# Patient Record
Sex: Female | Born: 1990
Health system: Southern US, Community
[De-identification: ages and names within clinical notes are randomized; demographics above are authoritative.]

## PROBLEM LIST (undated history)

## (undated) DIAGNOSIS — D649 Anemia, unspecified: Secondary | ICD-10-CM

## (undated) HISTORY — PX: WISDOM TOOTH EXTRACTION: SHX21

## (undated) HISTORY — PX: TONSILLECTOMY: SUR1361

## (undated) HISTORY — PX: CHOLECYSTECTOMY: SHX55

---

## 2019-09-10 DIAGNOSIS — Z131 Encounter for screening for diabetes mellitus: Secondary | ICD-10-CM | POA: Diagnosis not present

## 2019-09-10 DIAGNOSIS — Z13220 Encounter for screening for lipoid disorders: Secondary | ICD-10-CM | POA: Diagnosis not present

## 2019-09-10 DIAGNOSIS — Z0001 Encounter for general adult medical examination with abnormal findings: Secondary | ICD-10-CM | POA: Diagnosis not present

## 2019-09-10 DIAGNOSIS — Z23 Encounter for immunization: Secondary | ICD-10-CM | POA: Diagnosis not present

## 2019-09-18 DIAGNOSIS — Z0184 Encounter for antibody response examination: Secondary | ICD-10-CM | POA: Diagnosis not present

## 2019-09-18 DIAGNOSIS — M9902 Segmental and somatic dysfunction of thoracic region: Secondary | ICD-10-CM | POA: Diagnosis not present

## 2019-09-18 DIAGNOSIS — M542 Cervicalgia: Secondary | ICD-10-CM | POA: Diagnosis not present

## 2019-09-18 DIAGNOSIS — G44219 Episodic tension-type headache, not intractable: Secondary | ICD-10-CM | POA: Diagnosis not present

## 2019-09-18 DIAGNOSIS — M9901 Segmental and somatic dysfunction of cervical region: Secondary | ICD-10-CM | POA: Diagnosis not present

## 2019-09-25 DIAGNOSIS — M542 Cervicalgia: Secondary | ICD-10-CM | POA: Diagnosis not present

## 2019-09-25 DIAGNOSIS — G44219 Episodic tension-type headache, not intractable: Secondary | ICD-10-CM | POA: Diagnosis not present

## 2019-09-25 DIAGNOSIS — M9901 Segmental and somatic dysfunction of cervical region: Secondary | ICD-10-CM | POA: Diagnosis not present

## 2019-09-25 DIAGNOSIS — M9902 Segmental and somatic dysfunction of thoracic region: Secondary | ICD-10-CM | POA: Diagnosis not present

## 2019-09-26 DIAGNOSIS — G44219 Episodic tension-type headache, not intractable: Secondary | ICD-10-CM | POA: Diagnosis not present

## 2019-09-26 DIAGNOSIS — M9902 Segmental and somatic dysfunction of thoracic region: Secondary | ICD-10-CM | POA: Diagnosis not present

## 2019-09-26 DIAGNOSIS — M9901 Segmental and somatic dysfunction of cervical region: Secondary | ICD-10-CM | POA: Diagnosis not present

## 2019-09-26 DIAGNOSIS — M542 Cervicalgia: Secondary | ICD-10-CM | POA: Diagnosis not present

## 2019-09-30 DIAGNOSIS — M542 Cervicalgia: Secondary | ICD-10-CM | POA: Diagnosis not present

## 2019-09-30 DIAGNOSIS — M9902 Segmental and somatic dysfunction of thoracic region: Secondary | ICD-10-CM | POA: Diagnosis not present

## 2019-09-30 DIAGNOSIS — G44219 Episodic tension-type headache, not intractable: Secondary | ICD-10-CM | POA: Diagnosis not present

## 2019-09-30 DIAGNOSIS — M9901 Segmental and somatic dysfunction of cervical region: Secondary | ICD-10-CM | POA: Diagnosis not present

## 2019-10-01 DIAGNOSIS — M542 Cervicalgia: Secondary | ICD-10-CM | POA: Diagnosis not present

## 2019-10-01 DIAGNOSIS — M9902 Segmental and somatic dysfunction of thoracic region: Secondary | ICD-10-CM | POA: Diagnosis not present

## 2019-10-01 DIAGNOSIS — Z6820 Body mass index (BMI) 20.0-20.9, adult: Secondary | ICD-10-CM | POA: Diagnosis not present

## 2019-10-01 DIAGNOSIS — M9901 Segmental and somatic dysfunction of cervical region: Secondary | ICD-10-CM | POA: Diagnosis not present

## 2019-10-01 DIAGNOSIS — Z124 Encounter for screening for malignant neoplasm of cervix: Secondary | ICD-10-CM | POA: Diagnosis not present

## 2019-10-01 DIAGNOSIS — Z01419 Encounter for gynecological examination (general) (routine) without abnormal findings: Secondary | ICD-10-CM | POA: Diagnosis not present

## 2019-10-01 DIAGNOSIS — G44219 Episodic tension-type headache, not intractable: Secondary | ICD-10-CM | POA: Diagnosis not present

## 2019-10-02 DIAGNOSIS — M542 Cervicalgia: Secondary | ICD-10-CM | POA: Diagnosis not present

## 2019-10-02 DIAGNOSIS — G44219 Episodic tension-type headache, not intractable: Secondary | ICD-10-CM | POA: Diagnosis not present

## 2019-10-02 DIAGNOSIS — M9901 Segmental and somatic dysfunction of cervical region: Secondary | ICD-10-CM | POA: Diagnosis not present

## 2019-10-02 DIAGNOSIS — M9902 Segmental and somatic dysfunction of thoracic region: Secondary | ICD-10-CM | POA: Diagnosis not present

## 2019-10-07 DIAGNOSIS — G44219 Episodic tension-type headache, not intractable: Secondary | ICD-10-CM | POA: Diagnosis not present

## 2019-10-07 DIAGNOSIS — M542 Cervicalgia: Secondary | ICD-10-CM | POA: Diagnosis not present

## 2019-10-07 DIAGNOSIS — M9902 Segmental and somatic dysfunction of thoracic region: Secondary | ICD-10-CM | POA: Diagnosis not present

## 2019-10-07 DIAGNOSIS — M9901 Segmental and somatic dysfunction of cervical region: Secondary | ICD-10-CM | POA: Diagnosis not present

## 2019-10-09 DIAGNOSIS — M542 Cervicalgia: Secondary | ICD-10-CM | POA: Diagnosis not present

## 2019-10-09 DIAGNOSIS — M9901 Segmental and somatic dysfunction of cervical region: Secondary | ICD-10-CM | POA: Diagnosis not present

## 2019-10-09 DIAGNOSIS — G44219 Episodic tension-type headache, not intractable: Secondary | ICD-10-CM | POA: Diagnosis not present

## 2019-10-09 DIAGNOSIS — M9902 Segmental and somatic dysfunction of thoracic region: Secondary | ICD-10-CM | POA: Diagnosis not present

## 2019-10-10 DIAGNOSIS — M9901 Segmental and somatic dysfunction of cervical region: Secondary | ICD-10-CM | POA: Diagnosis not present

## 2019-10-10 DIAGNOSIS — G44219 Episodic tension-type headache, not intractable: Secondary | ICD-10-CM | POA: Diagnosis not present

## 2019-10-10 DIAGNOSIS — M542 Cervicalgia: Secondary | ICD-10-CM | POA: Diagnosis not present

## 2019-10-10 DIAGNOSIS — M9902 Segmental and somatic dysfunction of thoracic region: Secondary | ICD-10-CM | POA: Diagnosis not present

## 2019-10-14 DIAGNOSIS — M9901 Segmental and somatic dysfunction of cervical region: Secondary | ICD-10-CM | POA: Diagnosis not present

## 2019-10-14 DIAGNOSIS — G44219 Episodic tension-type headache, not intractable: Secondary | ICD-10-CM | POA: Diagnosis not present

## 2019-10-14 DIAGNOSIS — M542 Cervicalgia: Secondary | ICD-10-CM | POA: Diagnosis not present

## 2019-10-14 DIAGNOSIS — M9902 Segmental and somatic dysfunction of thoracic region: Secondary | ICD-10-CM | POA: Diagnosis not present

## 2019-10-16 DIAGNOSIS — M9902 Segmental and somatic dysfunction of thoracic region: Secondary | ICD-10-CM | POA: Diagnosis not present

## 2019-10-16 DIAGNOSIS — G44219 Episodic tension-type headache, not intractable: Secondary | ICD-10-CM | POA: Diagnosis not present

## 2019-10-16 DIAGNOSIS — M542 Cervicalgia: Secondary | ICD-10-CM | POA: Diagnosis not present

## 2019-10-16 DIAGNOSIS — M9901 Segmental and somatic dysfunction of cervical region: Secondary | ICD-10-CM | POA: Diagnosis not present

## 2019-10-17 DIAGNOSIS — M542 Cervicalgia: Secondary | ICD-10-CM | POA: Diagnosis not present

## 2019-10-17 DIAGNOSIS — M9902 Segmental and somatic dysfunction of thoracic region: Secondary | ICD-10-CM | POA: Diagnosis not present

## 2019-10-17 DIAGNOSIS — M9901 Segmental and somatic dysfunction of cervical region: Secondary | ICD-10-CM | POA: Diagnosis not present

## 2019-10-17 DIAGNOSIS — G44219 Episodic tension-type headache, not intractable: Secondary | ICD-10-CM | POA: Diagnosis not present

## 2019-10-21 DIAGNOSIS — M9901 Segmental and somatic dysfunction of cervical region: Secondary | ICD-10-CM | POA: Diagnosis not present

## 2019-10-21 DIAGNOSIS — M9902 Segmental and somatic dysfunction of thoracic region: Secondary | ICD-10-CM | POA: Diagnosis not present

## 2019-10-21 DIAGNOSIS — M542 Cervicalgia: Secondary | ICD-10-CM | POA: Diagnosis not present

## 2019-10-21 DIAGNOSIS — G44219 Episodic tension-type headache, not intractable: Secondary | ICD-10-CM | POA: Diagnosis not present

## 2019-10-23 DIAGNOSIS — M9902 Segmental and somatic dysfunction of thoracic region: Secondary | ICD-10-CM | POA: Diagnosis not present

## 2019-10-23 DIAGNOSIS — M9901 Segmental and somatic dysfunction of cervical region: Secondary | ICD-10-CM | POA: Diagnosis not present

## 2019-10-23 DIAGNOSIS — G44219 Episodic tension-type headache, not intractable: Secondary | ICD-10-CM | POA: Diagnosis not present

## 2019-10-23 DIAGNOSIS — M542 Cervicalgia: Secondary | ICD-10-CM | POA: Diagnosis not present

## 2019-10-24 DIAGNOSIS — Z113 Encounter for screening for infections with a predominantly sexual mode of transmission: Secondary | ICD-10-CM | POA: Diagnosis not present

## 2019-10-24 DIAGNOSIS — M542 Cervicalgia: Secondary | ICD-10-CM | POA: Diagnosis not present

## 2019-10-24 DIAGNOSIS — Z3490 Encounter for supervision of normal pregnancy, unspecified, unspecified trimester: Secondary | ICD-10-CM | POA: Diagnosis not present

## 2019-10-24 DIAGNOSIS — Z3201 Encounter for pregnancy test, result positive: Secondary | ICD-10-CM | POA: Diagnosis not present

## 2019-10-24 DIAGNOSIS — G44219 Episodic tension-type headache, not intractable: Secondary | ICD-10-CM | POA: Diagnosis not present

## 2019-10-24 DIAGNOSIS — Z6821 Body mass index (BMI) 21.0-21.9, adult: Secondary | ICD-10-CM | POA: Diagnosis not present

## 2019-10-24 DIAGNOSIS — N925 Other specified irregular menstruation: Secondary | ICD-10-CM | POA: Diagnosis not present

## 2019-10-24 DIAGNOSIS — M9901 Segmental and somatic dysfunction of cervical region: Secondary | ICD-10-CM | POA: Diagnosis not present

## 2019-10-24 DIAGNOSIS — M9902 Segmental and somatic dysfunction of thoracic region: Secondary | ICD-10-CM | POA: Diagnosis not present

## 2019-10-28 DIAGNOSIS — M9902 Segmental and somatic dysfunction of thoracic region: Secondary | ICD-10-CM | POA: Diagnosis not present

## 2019-10-28 DIAGNOSIS — M9901 Segmental and somatic dysfunction of cervical region: Secondary | ICD-10-CM | POA: Diagnosis not present

## 2019-10-28 DIAGNOSIS — G44219 Episodic tension-type headache, not intractable: Secondary | ICD-10-CM | POA: Diagnosis not present

## 2019-10-28 DIAGNOSIS — M542 Cervicalgia: Secondary | ICD-10-CM | POA: Diagnosis not present

## 2019-10-29 DIAGNOSIS — M542 Cervicalgia: Secondary | ICD-10-CM | POA: Diagnosis not present

## 2019-10-29 DIAGNOSIS — G44219 Episodic tension-type headache, not intractable: Secondary | ICD-10-CM | POA: Diagnosis not present

## 2019-10-29 DIAGNOSIS — M9901 Segmental and somatic dysfunction of cervical region: Secondary | ICD-10-CM | POA: Diagnosis not present

## 2019-10-29 DIAGNOSIS — M9902 Segmental and somatic dysfunction of thoracic region: Secondary | ICD-10-CM | POA: Diagnosis not present

## 2019-10-30 DIAGNOSIS — M9901 Segmental and somatic dysfunction of cervical region: Secondary | ICD-10-CM | POA: Diagnosis not present

## 2019-10-30 DIAGNOSIS — M542 Cervicalgia: Secondary | ICD-10-CM | POA: Diagnosis not present

## 2019-10-30 DIAGNOSIS — G44219 Episodic tension-type headache, not intractable: Secondary | ICD-10-CM | POA: Diagnosis not present

## 2019-10-30 DIAGNOSIS — M9902 Segmental and somatic dysfunction of thoracic region: Secondary | ICD-10-CM | POA: Diagnosis not present

## 2019-11-04 DIAGNOSIS — M9902 Segmental and somatic dysfunction of thoracic region: Secondary | ICD-10-CM | POA: Diagnosis not present

## 2019-11-04 DIAGNOSIS — M542 Cervicalgia: Secondary | ICD-10-CM | POA: Diagnosis not present

## 2019-11-04 DIAGNOSIS — M9901 Segmental and somatic dysfunction of cervical region: Secondary | ICD-10-CM | POA: Diagnosis not present

## 2019-11-04 DIAGNOSIS — G44219 Episodic tension-type headache, not intractable: Secondary | ICD-10-CM | POA: Diagnosis not present

## 2019-11-06 DIAGNOSIS — M9901 Segmental and somatic dysfunction of cervical region: Secondary | ICD-10-CM | POA: Diagnosis not present

## 2019-11-06 DIAGNOSIS — M542 Cervicalgia: Secondary | ICD-10-CM | POA: Diagnosis not present

## 2019-11-06 DIAGNOSIS — M9902 Segmental and somatic dysfunction of thoracic region: Secondary | ICD-10-CM | POA: Diagnosis not present

## 2019-11-06 DIAGNOSIS — G44219 Episodic tension-type headache, not intractable: Secondary | ICD-10-CM | POA: Diagnosis not present

## 2019-11-08 NOTE — L&D Delivery Note (Signed)
Delivery Note At 10:30 PM a viable and healthy female was delivered via  (Presentation: Left Occiput Anterior).  APGAR: 9, 9; weight pending .   Placenta status: Spontaneous, Intact.  Cord: 3 vessels   The patient pushed for an hour and twelve minutes without an epidural . She delivered a vigorous female infant in the vertex LOA presentation with Apgars of 9 at 1 and 9 at 5 minutes. After a 1 minute delay in cord clamping the cord was clamped and cut. The placenta delivered spontaneously, intact, with a 3V cord. A 2nd degree perineal laceration was repaired with 3-0 vicryl rapide after the perineum was infiltrated with 1% lidocaine. Mom and baby are doing well after delivery  Anesthesia: Local Episiotomy: None Lacerations: 2nd degree Suture Repair: 3.0 vicryl rapide Est. Blood Loss (mL):  317 cc  Mom to postpartum.  Baby to Couplet care / Skin to Skin.  Waynard Reeds 06/07/2020, 10:57 PM

## 2019-11-11 DIAGNOSIS — M9902 Segmental and somatic dysfunction of thoracic region: Secondary | ICD-10-CM | POA: Diagnosis not present

## 2019-11-11 DIAGNOSIS — M9901 Segmental and somatic dysfunction of cervical region: Secondary | ICD-10-CM | POA: Diagnosis not present

## 2019-11-11 DIAGNOSIS — M542 Cervicalgia: Secondary | ICD-10-CM | POA: Diagnosis not present

## 2019-11-11 DIAGNOSIS — G44219 Episodic tension-type headache, not intractable: Secondary | ICD-10-CM | POA: Diagnosis not present

## 2019-11-14 DIAGNOSIS — Z1389 Encounter for screening for other disorder: Secondary | ICD-10-CM | POA: Diagnosis not present

## 2019-11-14 DIAGNOSIS — Z3481 Encounter for supervision of other normal pregnancy, first trimester: Secondary | ICD-10-CM | POA: Diagnosis not present

## 2019-11-14 DIAGNOSIS — Z369 Encounter for antenatal screening, unspecified: Secondary | ICD-10-CM | POA: Diagnosis not present

## 2019-11-18 DIAGNOSIS — M9901 Segmental and somatic dysfunction of cervical region: Secondary | ICD-10-CM | POA: Diagnosis not present

## 2019-11-18 DIAGNOSIS — G44219 Episodic tension-type headache, not intractable: Secondary | ICD-10-CM | POA: Diagnosis not present

## 2019-11-18 DIAGNOSIS — M9902 Segmental and somatic dysfunction of thoracic region: Secondary | ICD-10-CM | POA: Diagnosis not present

## 2019-11-18 DIAGNOSIS — M542 Cervicalgia: Secondary | ICD-10-CM | POA: Diagnosis not present

## 2019-11-27 DIAGNOSIS — Z3480 Encounter for supervision of other normal pregnancy, unspecified trimester: Secondary | ICD-10-CM | POA: Diagnosis not present

## 2019-11-27 DIAGNOSIS — Z369 Encounter for antenatal screening, unspecified: Secondary | ICD-10-CM | POA: Diagnosis not present

## 2019-12-25 DIAGNOSIS — Z369 Encounter for antenatal screening, unspecified: Secondary | ICD-10-CM | POA: Diagnosis not present

## 2019-12-25 DIAGNOSIS — Z3480 Encounter for supervision of other normal pregnancy, unspecified trimester: Secondary | ICD-10-CM | POA: Diagnosis not present

## 2020-01-22 DIAGNOSIS — Z363 Encounter for antenatal screening for malformations: Secondary | ICD-10-CM | POA: Diagnosis not present

## 2020-01-23 ENCOUNTER — Ambulatory Visit: Payer: Self-pay | Attending: Internal Medicine

## 2020-01-23 DIAGNOSIS — Z23 Encounter for immunization: Secondary | ICD-10-CM

## 2020-01-23 NOTE — Progress Notes (Signed)
   Covid-19 Vaccination Clinic  Name:  Evelyn Lara    MRN: [id number] DOB: 01-09-1991  01/23/2020  Evelyn Lara was observed post Covid-19 immunization for 15 minutes without incident. She was provided with Vaccine Information Sheet and instruction to access the V-Safe system.   Evelyn Lara was instructed to call 911 with any severe reactions post vaccine: Marland Kitchen Difficulty breathing  . Swelling of face and throat  . A fast heartbeat  . A bad rash all over body  . Dizziness and weakness   Immunizations Administered    Name Date Dose VIS Date Route   Pfizer COVID-19 Vaccine 01/23/2020  1:24 PM 0.3 mL 10/18/2019 Intramuscular   Manufacturer: [employer], [employer]   Lot: [id number]   NDC: [address]-5

## 2020-02-17 ENCOUNTER — Ambulatory Visit: Payer: Self-pay | Attending: Internal Medicine

## 2020-02-17 DIAGNOSIS — Z23 Encounter for immunization: Secondary | ICD-10-CM

## 2020-02-17 NOTE — Progress Notes (Signed)
   Covid-19 Vaccination Clinic  Name:  Harjit Douds    MRN: [id number] DOB: January 23, 1991  02/17/2020  Ms. Catano was observed post Covid-19 immunization for 15 minutes without incident. She was provided with Vaccine Information Sheet and instruction to access the V-Safe system.   Ms. Fichter was instructed to call 911 with any severe reactions post vaccine: Marland Kitchen Difficulty breathing  . Swelling of face and throat  . A fast heartbeat  . A bad rash all over body  . Dizziness and weakness   Immunizations Administered    Name Date Dose VIS Date Route   Pfizer COVID-19 Vaccine 02/17/2020  4:41 PM 0.3 mL 10/18/2019 Intramuscular   Manufacturer: [employer], [employer]   Lot: [id number]   NDC: [address]-8

## 2020-02-20 DIAGNOSIS — Z369 Encounter for antenatal screening, unspecified: Secondary | ICD-10-CM | POA: Diagnosis not present

## 2020-03-09 DIAGNOSIS — Z3483 Encounter for supervision of other normal pregnancy, third trimester: Secondary | ICD-10-CM | POA: Diagnosis not present

## 2020-03-09 DIAGNOSIS — Z3482 Encounter for supervision of other normal pregnancy, second trimester: Secondary | ICD-10-CM | POA: Diagnosis not present

## 2020-03-12 ENCOUNTER — Other Ambulatory Visit: Payer: Self-pay

## 2020-03-12 ENCOUNTER — Encounter (HOSPITAL_COMMUNITY): Payer: Self-pay

## 2020-03-12 ENCOUNTER — Inpatient Hospital Stay (HOSPITAL_COMMUNITY)
Admission: AD | Admit: 2020-03-12 | Discharge: 2020-03-12 | Disposition: A | Discharge status: Home / Self Care | Payer: BC Managed Care – PPO | Attending: Obstetrics | Admitting: Obstetrics

## 2020-03-12 DIAGNOSIS — Z3A27 27 weeks gestation of pregnancy: Secondary | ICD-10-CM | POA: Insufficient documentation

## 2020-03-12 DIAGNOSIS — O368120 Decreased fetal movements, second trimester, not applicable or unspecified: Secondary | ICD-10-CM | POA: Insufficient documentation

## 2020-03-12 NOTE — MAU Note (Signed)
.   Evelyn Lara is a 29 y.o. at [redacted]w[redacted]d here in MAU reporting: Patient reports decreased fetal movement all day but began to worry around 1620 this afternoon. She tried to do kick counts with food and cold water but she states that he still only moved slightly. No VB or LOF.   Pain score: 0 Vitals:   03/12/20 1851  BP: 126/67  Pulse: 65  Resp: 16  Temp: 97.9 F (36.6 C)  SpO2: 100%     FHT:140 Lab orders placed from triage:

## 2020-03-12 NOTE — MAU Provider Note (Signed)
History     CSN: [id number]  Arrival date and time: 03/12/20 1830   First Provider Initiated Contact with Patient 03/12/20 1944      Chief Complaint  Patient presents with  . Decreased Fetal Movement   HPI  Evelyn Lara is a 29 yo G1P0 at [redacted] weeks EGA who is presenting to MAU with concerns of decreased fetal movement. She reports that she hasn't really felt her baby move much today, but became more concerned around 4:30 this afternoon. She had a snack and cold water, then tried to lay down, but didn't feel any big movements.  She denies vaginal bleeding or leakage of fluid. She denies contractions.  She does say that since she has been in MAU, she has felt her baby move a lot, she has pressed the clicker over 12 times and is reassured by hearing her baby's heart beat.  OB History    Gravida  1   Para      Term      Preterm      AB      Living        SAB      TAB      Ectopic      Multiple      Live Births              History reviewed. No pertinent past medical history.  Past Surgical History:  Procedure Laterality Date  . CHOLECYSTECTOMY    . TONSILLECTOMY    . WISDOM TOOTH EXTRACTION      History reviewed. No pertinent family history.  Social History   Tobacco Use  . Smoking status: Never Smoker  . Smokeless tobacco: Never Used  Substance Use Topics  . Alcohol use: Not Currently  . Drug use: Never    Allergies: No Known Allergies  Medications Prior to Admission  Medication Sig Dispense Refill Last Dose  . Prenatal Vit-Fe Fumarate-FA (PRENATAL MULTIVITAMIN) TABS tablet Take 1 tablet by mouth daily at 12 noon.   03/12/2020 at Unknown time    Review of Systems  All other systems reviewed and are negative.  Physical Exam   Blood pressure 126/67, pulse 65, temperature 97.9 F (36.6 C), temperature source Oral, resp. rate 16, height 5\' 11"  (1.803 m), weight 79.5 kg, SpO2 100 %.  Physical Exam  Nursing note and vitals  reviewed. Constitutional: She is oriented to person, place, and time. She appears well-developed and well-nourished.  HENT:  Head: Normocephalic and atraumatic.  Eyes: Pupils are equal, round, and reactive to light. Conjunctivae and EOM are normal.  Cardiovascular: Normal rate, regular rhythm, normal heart sounds and intact distal pulses.  Respiratory: Effort normal and breath sounds normal.  GI: Soft. Bowel sounds are normal. She exhibits no distension and no mass. There is no abdominal tenderness. There is no rebound and no guarding.  Musculoskeletal:        General: Normal range of motion.     Cervical back: Normal range of motion and neck supple.  Neurological: She is alert and oriented to person, place, and time. She has normal reflexes.  Skin: Skin is warm and dry.  Psychiatric: She has a normal mood and affect. Her behavior is normal. Judgment and thought content normal.    MAU Course  Procedures  MDM - Has felt baby move multiple times since coming into MAU  NST -baseline: 130 -variability: moderate -accels: 10x10 -decels: none -interpretation: reactive, appropriate for gestational age   Assessment and Plan  29 yo G1P0 at 27 weeks 3 days with DFM -has felt baby move multiple times since coming to MAU -NST reactive and appropriate for gestational age -educated on fetal kick counts -DC to home with return precautions -Follow up at [facility] appt tomorrow as scheduled  [place] 03/12/2020, 7:47 PM

## 2020-03-12 NOTE — Discharge Instructions (Signed)
Fetal Movement Counts Patient Name: ________________________________________________ Patient Due Date: ____________________ What is a fetal movement count?  A fetal movement count is the number of times that you feel your baby move during a certain amount of time. This may also be called a fetal kick count. A fetal movement count is recommended for every pregnant woman. You may be asked to start counting fetal movements as early as week 28 of your pregnancy. Pay attention to when your baby is most active. You may notice your baby's sleep and wake cycles. You may also notice things that make your baby move more. You should do a fetal movement count:  When your baby is normally most active.  At the same time each day. A good time to count movements is while you are resting, after having something to eat and drink. How do I count fetal movements? 1. Find a quiet, comfortable area. Sit, or lie down on your side. 2. Write down the date, the start time and stop time, and the number of movements that you felt between those two times. Take this information with you to your health care visits. 3. Write down your start time when you feel the first movement. 4. Count kicks, flutters, swishes, rolls, and jabs. You should feel at least 10 movements. 5. You may stop counting after you have felt 10 movements, or if you have been counting for 2 hours. Write down the stop time. 6. If you do not feel 10 movements in 2 hours, contact your health care provider for further instructions. Your health care provider may want to do additional tests to assess your baby's well-being. Contact a health care provider if:  You feel fewer than 10 movements in 2 hours.  Your baby is not moving like he or she usually does. Date: ____________ Start time: ____________ Stop time: ____________ Movements: ____________ Date: ____________ Start time: ____________ Stop time: ____________ Movements: ____________ Date: ____________  Start time: ____________ Stop time: ____________ Movements: ____________ Date: ____________ Start time: ____________ Stop time: ____________ Movements: ____________ Date: ____________ Start time: ____________ Stop time: ____________ Movements: ____________ Date: ____________ Start time: ____________ Stop time: ____________ Movements: ____________ Date: ____________ Start time: ____________ Stop time: ____________ Movements: ____________ Date: ____________ Start time: ____________ Stop time: ____________ Movements: ____________ Date: ____________ Start time: ____________ Stop time: ____________ Movements: ____________ This information is not intended to replace advice given to you by your health care provider. Make sure you discuss any questions you have with your health care provider. Document Revised: 06/13/2019 Document Reviewed: 06/13/2019 Elsevier Patient Education  2020 Elsevier Inc.  

## 2020-03-13 DIAGNOSIS — Z3480 Encounter for supervision of other normal pregnancy, unspecified trimester: Secondary | ICD-10-CM | POA: Diagnosis not present

## 2020-03-13 DIAGNOSIS — Z23 Encounter for immunization: Secondary | ICD-10-CM | POA: Diagnosis not present

## 2020-04-10 DIAGNOSIS — Z369 Encounter for antenatal screening, unspecified: Secondary | ICD-10-CM | POA: Diagnosis not present

## 2020-04-16 DIAGNOSIS — F4323 Adjustment disorder with mixed anxiety and depressed mood: Secondary | ICD-10-CM | POA: Diagnosis not present

## 2020-04-21 DIAGNOSIS — F4323 Adjustment disorder with mixed anxiety and depressed mood: Secondary | ICD-10-CM | POA: Diagnosis not present

## 2020-04-22 DIAGNOSIS — Z369 Encounter for antenatal screening, unspecified: Secondary | ICD-10-CM | POA: Diagnosis not present

## 2020-05-07 DIAGNOSIS — Z369 Encounter for antenatal screening, unspecified: Secondary | ICD-10-CM | POA: Diagnosis not present

## 2020-05-13 DIAGNOSIS — Z3480 Encounter for supervision of other normal pregnancy, unspecified trimester: Secondary | ICD-10-CM | POA: Diagnosis not present

## 2020-05-13 DIAGNOSIS — Z369 Encounter for antenatal screening, unspecified: Secondary | ICD-10-CM | POA: Diagnosis not present

## 2020-05-14 DIAGNOSIS — F4323 Adjustment disorder with mixed anxiety and depressed mood: Secondary | ICD-10-CM | POA: Diagnosis not present

## 2020-05-15 LAB — OB RESULTS CONSOLE GBS: GBS: NEGATIVE

## 2020-05-22 DIAGNOSIS — Z369 Encounter for antenatal screening, unspecified: Secondary | ICD-10-CM | POA: Diagnosis not present

## 2020-05-28 DIAGNOSIS — Z369 Encounter for antenatal screening, unspecified: Secondary | ICD-10-CM | POA: Diagnosis not present

## 2020-06-03 DIAGNOSIS — Z369 Encounter for antenatal screening, unspecified: Secondary | ICD-10-CM | POA: Diagnosis not present

## 2020-06-07 ENCOUNTER — Inpatient Hospital Stay (HOSPITAL_COMMUNITY)
Admission: AD | Admit: 2020-06-07 | Discharge: 2020-06-09 | DRG: 807 | Disposition: A | Discharge status: Home / Self Care | Payer: BC Managed Care – PPO | Attending: Obstetrics and Gynecology | Admitting: Obstetrics and Gynecology

## 2020-06-07 ENCOUNTER — Encounter (HOSPITAL_COMMUNITY): Payer: Self-pay | Admitting: Obstetrics and Gynecology

## 2020-06-07 ENCOUNTER — Other Ambulatory Visit: Payer: Self-pay

## 2020-06-07 DIAGNOSIS — Z3A39 39 weeks gestation of pregnancy: Secondary | ICD-10-CM

## 2020-06-07 DIAGNOSIS — Z20822 Contact with and (suspected) exposure to covid-19: Secondary | ICD-10-CM | POA: Diagnosis not present

## 2020-06-07 DIAGNOSIS — O26893 Other specified pregnancy related conditions, third trimester: Secondary | ICD-10-CM | POA: Diagnosis not present

## 2020-06-07 HISTORY — DX: Anemia, unspecified: D64.9

## 2020-06-07 LAB — TYPE AND SCREEN
ABO/RH(D): A POS
Antibody Screen: NEGATIVE

## 2020-06-07 LAB — CBC
HCT: 33.8 % — ABNORMAL LOW (ref 36.0–46.0)
Hemoglobin: 10.7 g/dL — ABNORMAL LOW (ref 12.0–15.0)
MCH: 29.3 pg (ref 26.0–34.0)
MCHC: 31.7 g/dL (ref 30.0–36.0)
MCV: 92.6 fL (ref 80.0–100.0)
Platelets: 159 10*3/uL (ref 150–400)
RBC: 3.65 MIL/uL — ABNORMAL LOW (ref 3.87–5.11)
RDW: 13.5 % (ref 11.5–15.5)
WBC: 8.7 10*3/uL (ref 4.0–10.5)
nRBC: 0 % (ref 0.0–0.2)

## 2020-06-07 LAB — ABO/RH: ABO/RH(D): A POS

## 2020-06-07 LAB — POCT FERN TEST

## 2020-06-07 LAB — SARS CORONAVIRUS 2 BY RT PCR (HOSPITAL ORDER, PERFORMED IN ~~LOC~~ HOSPITAL LAB): SARS Coronavirus 2: NEGATIVE

## 2020-06-07 LAB — RPR: RPR Ser Ql: NONREACTIVE

## 2020-06-07 MED ORDER — METHYLERGONOVINE MALEATE 0.2 MG/ML IJ SOLN
INTRAMUSCULAR | Status: AC
  Filled 2020-06-07: qty 1

## 2020-06-07 MED ORDER — OXYCODONE-ACETAMINOPHEN 5-325 MG PO TABS: 1.0000 | ORAL_TABLET | ORAL | Status: DC | PRN

## 2020-06-07 MED ORDER — LACTATED RINGERS IV SOLN: INTRAVENOUS | Status: DC

## 2020-06-07 MED ORDER — OXYTOCIN-SODIUM CHLORIDE 30-0.9 UT/500ML-% IV SOLN
1.0000 m[IU]/min | INTRAVENOUS | Status: DC
  Administered 2020-06-07: 2 m[IU]/min via INTRAVENOUS
  Filled 2020-06-07: qty 500

## 2020-06-07 MED ORDER — OXYTOCIN-SODIUM CHLORIDE 30-0.9 UT/500ML-% IV SOLN: 2.5000 [IU]/h | INTRAVENOUS | Status: DC

## 2020-06-07 MED ORDER — ACETAMINOPHEN 325 MG PO TABS: 650.0000 mg | ORAL_TABLET | ORAL | Status: DC | PRN

## 2020-06-07 MED ORDER — OXYTOCIN BOLUS FROM INFUSION
333.0000 mL | Freq: Once | INTRAVENOUS | Status: AC
  Administered 2020-06-07: 333 mL via INTRAVENOUS

## 2020-06-07 MED ORDER — TERBUTALINE SULFATE 1 MG/ML IJ SOLN: 0.2500 mg | Freq: Once | INTRAMUSCULAR | Status: DC | PRN

## 2020-06-07 MED ORDER — SOD CITRATE-CITRIC ACID 500-334 MG/5ML PO SOLN: 30.0000 mL | ORAL | Status: DC | PRN

## 2020-06-07 MED ORDER — LACTATED RINGERS IV SOLN: 500.0000 mL | INTRAVENOUS | Status: DC | PRN

## 2020-06-07 MED ORDER — ONDANSETRON HCL 4 MG/2ML IJ SOLN: 4.0000 mg | Freq: Four times a day (QID) | INTRAMUSCULAR | Status: DC | PRN

## 2020-06-07 MED ORDER — LIDOCAINE HCL (PF) 1 % IJ SOLN
30.0000 mL | INTRAMUSCULAR | Status: AC | PRN
  Administered 2020-06-07: 30 mL via SUBCUTANEOUS
  Filled 2020-06-07: qty 30

## 2020-06-07 MED ORDER — SODIUM CHLORIDE 0.9% FLUSH: 3.0000 mL | Freq: Two times a day (BID) | INTRAVENOUS | Status: DC

## 2020-06-07 MED ORDER — OXYCODONE-ACETAMINOPHEN 5-325 MG PO TABS: 2.0000 | ORAL_TABLET | ORAL | Status: DC | PRN

## 2020-06-07 MED ORDER — FLEET ENEMA 7-19 GM/118ML RE ENEM: 1.0000 | ENEMA | RECTAL | Status: DC | PRN

## 2020-06-07 MED ORDER — FENTANYL CITRATE (PF) 100 MCG/2ML IJ SOLN: 50.0000 ug | INTRAMUSCULAR | Status: DC | PRN

## 2020-06-07 MED ORDER — SODIUM CHLORIDE 0.9 % IV SOLN: 250.0000 mL | INTRAVENOUS | Status: DC | PRN

## 2020-06-07 MED ORDER — SODIUM CHLORIDE 0.9% FLUSH: 3.0000 mL | INTRAVENOUS | Status: DC | PRN

## 2020-06-07 NOTE — Progress Notes (Signed)
Pt informed that Dr Tenny Craw has been monitoring FHR strip from home.  At this time Dr Tenny Craw doesn't see anything concerning.  Offered pt to check her cervix.  Pt declined.  Dr Tenny Craw informed

## 2020-06-07 NOTE — Progress Notes (Signed)
[phone] changing from wired monitors to telemetry monitors

## 2020-06-07 NOTE — Progress Notes (Signed)
Tens unit removed

## 2020-06-07 NOTE — MAU Note (Signed)
Pt presents with complaint of gush of fluid at 0222 this am, contractions started at that time. Denies problems with the pregnancy. Reports good fetal movement

## 2020-06-07 NOTE — Progress Notes (Signed)
10 Instruments 5 9*9 5 4*18 2 Injectables *No sharps PAD

## 2020-06-07 NOTE — Progress Notes (Signed)
Pt applied own tens unit

## 2020-06-07 NOTE — Progress Notes (Signed)
Patient ID: Evelyn Lara, female   DOB: [birth year], 29 y.o.   MRN: [id number]  S: Pt feeling minimal contractions O: AFVSS AOX3,  FHR 130 reactive, cat 1 tracing cvx 3/70/-2 toco irregular  D/W pt that its been ~ 12 hrs since SROM with minimal cervical change. Recommended augmentation given lack of spontaneous labor.  Pt desires minimal medical intervention. DIsucssed available modalities for pain relief other than epidural. Discussed various reasons and rationale for specific medical interventions if required. Pt ready to proceed with augmentation with pitocin

## 2020-06-07 NOTE — Progress Notes (Signed)
Dr Tenny Craw discussing with Evelyn Lara and husband use of Pitocin vs "natural" labor, including potential complications.

## 2020-06-07 NOTE — Progress Notes (Signed)
Cardio adjusted several times having difficulty getting uc to trace

## 2020-06-07 NOTE — H&P (Signed)
Evelyn Lara is a 29 y.o. female presenting for leaking fluid  29 yo G1P0 @ 39+6 presents for leaking fluid and confirmed SROM. Her pregnancy has been otherwise uncomplicated. On admission she was 2/50/-2 which is unchanged from her last exam in the office. She was having rare contractions on admission. She declines augmentation at this time and desires minimal intervention  OB History    Gravida  1   Para      Term      Preterm      AB      Living        SAB      TAB      Ectopic      Multiple      Live Births             Past Medical History:  Diagnosis Date  . Anemia    Past Surgical History:  Procedure Laterality Date  . CHOLECYSTECTOMY    . TONSILLECTOMY    . WISDOM TOOTH EXTRACTION     Family History: family history includes Arthritis in her maternal grandmother; Asthma in her maternal grandmother; COPD in her father; Cancer in her paternal grandfather; Diabetes in her brother, father, maternal grandmother, and paternal grandmother; Drug abuse in her father; Heart disease in her father and maternal grandmother; Hyperlipidemia in her maternal grandmother. Social History:  reports that she has never smoked. She has never used smokeless tobacco. She reports previous alcohol use. She reports that she does not use drugs.     Maternal Diabetes: No Genetic Screening: Normal Maternal Ultrasounds/Referrals: Normal Fetal Ultrasounds or other Referrals:  None Maternal Substance Abuse:  No Significant Maternal Medications:  None Significant Maternal Lab Results:  None Other Comments:  None  Review of Systems History Dilation: 3 Effacement (%): 70 Station: -2 Exam by:: Dr Tenny Craw Blood pressure (!) 131/90, pulse 79, temperature 98.2 F (36.8 C), temperature source Oral, resp. rate 20, height 5\' 11"  (1.803 m), weight 83.9 kg, SpO2 100 %. Exam Physical Exam  Prenatal labs: ABO, Rh: --/--/A POS Performed at [facility], [address], [place],  [place]  [phone] 1050) Antibody: NEG (08/01 0447) Rubella:  imm RPR: NON REACTIVE (08/01 0447)  HBsAg:   Neg HIV:   NR GBS: Negative/-- (07/09 0000)   Assessment/Plan: 1) Admit 2) Epidural on request 3) Anticipate need for augmentation. Pt desires to wait at this time   05-31-2003 06/07/2020, 6:00 PM

## 2020-06-07 NOTE — Progress Notes (Signed)
Battery on telemetry monitors need recharging.  Telemetry monitors removed and wired cardio and toco applied

## 2020-06-07 NOTE — Plan of Care (Signed)

## 2020-06-07 NOTE — Progress Notes (Signed)
RN at bedside from [phone] attempting to find FHT's

## 2020-06-07 NOTE — Progress Notes (Signed)
RN at bedside [phone] attempting to get most consistent tracing . Pt  Leaning forward durring uc's manking it difficult to trace fhr

## 2020-06-08 ENCOUNTER — Encounter (HOSPITAL_COMMUNITY): Payer: Self-pay | Admitting: Obstetrics and Gynecology

## 2020-06-08 LAB — CBC
HCT: 30.5 % — ABNORMAL LOW (ref 36.0–46.0)
Hemoglobin: 10 g/dL — ABNORMAL LOW (ref 12.0–15.0)
MCH: 29.9 pg (ref 26.0–34.0)
MCHC: 32.8 g/dL (ref 30.0–36.0)
MCV: 91.3 fL (ref 80.0–100.0)
Platelets: 162 10*3/uL (ref 150–400)
RBC: 3.34 MIL/uL — ABNORMAL LOW (ref 3.87–5.11)
RDW: 13.4 % (ref 11.5–15.5)
WBC: 16.2 10*3/uL — ABNORMAL HIGH (ref 4.0–10.5)
nRBC: 0 % (ref 0.0–0.2)

## 2020-06-08 MED ORDER — DIBUCAINE (PERIANAL) 1 % EX OINT: 1.0000 "application " | TOPICAL_OINTMENT | CUTANEOUS | Status: DC | PRN

## 2020-06-08 MED ORDER — ACETAMINOPHEN 325 MG PO TABS
650.0000 mg | ORAL_TABLET | ORAL | Status: DC | PRN
  Administered 2020-06-08 – 2020-06-09 (×7): 650 mg via ORAL
  Filled 2020-06-08 (×7): qty 2

## 2020-06-08 MED ORDER — METHYLERGONOVINE MALEATE 0.2 MG/ML IJ SOLN: 0.2000 mg | INTRAMUSCULAR | Status: DC | PRN

## 2020-06-08 MED ORDER — DIPHENHYDRAMINE HCL 25 MG PO CAPS: 25.0000 mg | ORAL_CAPSULE | Freq: Four times a day (QID) | ORAL | Status: DC | PRN

## 2020-06-08 MED ORDER — ONDANSETRON HCL 4 MG/2ML IJ SOLN: 4.0000 mg | INTRAMUSCULAR | Status: DC | PRN

## 2020-06-08 MED ORDER — BENZOCAINE-MENTHOL 20-0.5 % EX AERO
1.0000 "application " | INHALATION_SPRAY | CUTANEOUS | Status: DC | PRN
  Administered 2020-06-08: 1 via TOPICAL
  Filled 2020-06-08: qty 56

## 2020-06-08 MED ORDER — WITCH HAZEL-GLYCERIN EX PADS: 1.0000 "application " | MEDICATED_PAD | CUTANEOUS | Status: DC | PRN

## 2020-06-08 MED ORDER — OXYCODONE HCL 5 MG PO TABS: 5.0000 mg | ORAL_TABLET | ORAL | Status: DC | PRN

## 2020-06-08 MED ORDER — COCONUT OIL OIL: 1.0000 "application " | TOPICAL_OIL | Status: DC | PRN

## 2020-06-08 MED ORDER — SENNOSIDES-DOCUSATE SODIUM 8.6-50 MG PO TABS
2.0000 | ORAL_TABLET | ORAL | Status: DC
  Administered 2020-06-08: 2 via ORAL
  Filled 2020-06-08 (×2): qty 2

## 2020-06-08 MED ORDER — TETANUS-DIPHTH-ACELL PERTUSSIS 5-2.5-18.5 LF-MCG/0.5 IM SUSP: 0.5000 mL | Freq: Once | INTRAMUSCULAR | Status: DC

## 2020-06-08 MED ORDER — IBUPROFEN 600 MG PO TABS
600.0000 mg | ORAL_TABLET | Freq: Four times a day (QID) | ORAL | Status: DC
  Filled 2020-06-08 (×3): qty 1

## 2020-06-08 MED ORDER — OXYCODONE HCL 5 MG PO TABS: 10.0000 mg | ORAL_TABLET | ORAL | Status: DC | PRN

## 2020-06-08 MED ORDER — PRENATAL MULTIVITAMIN CH
1.0000 | ORAL_TABLET | Freq: Every day | ORAL | Status: DC
  Administered 2020-06-08 – 2020-06-09 (×2): 1 via ORAL
  Filled 2020-06-08 (×2): qty 1

## 2020-06-08 MED ORDER — ONDANSETRON HCL 4 MG PO TABS: 4.0000 mg | ORAL_TABLET | ORAL | Status: DC | PRN

## 2020-06-08 MED ORDER — METHYLERGONOVINE MALEATE 0.2 MG PO TABS: 0.2000 mg | ORAL_TABLET | ORAL | Status: DC | PRN

## 2020-06-08 MED ORDER — SIMETHICONE 80 MG PO CHEW: 80.0000 mg | CHEWABLE_TABLET | ORAL | Status: DC | PRN

## 2020-06-08 NOTE — Lactation Note (Signed)
This note was copied from a baby's chart. Lactation Consultation Note  Patient Name: Evelyn Lara EHMCN'O Date: 06/08/2020 Reason for consult: Initial assessment;Primapara;Term;Infant < 6lbs;Nipple pain/trauma;Other (Comment) (SGA)  Mom is a P1.  Infant full term and SGA.  Low blood sugars LC entered room and infant STS with mom as recommended.  No hat on at this time. Mom reports she has a Medela DEBP at home.   Mom reports her nipples are very sore.  Mom reports nipples inverted.   Mom has not initiated pumping and infant less than 6 pounds.  Discussed importance of pumping for Infant to try and help him get more milk sooner. Mom agreed.   Parents already doing donor milk.  Did not observe a feeding.  Urged mom to feed on cue and 8-12 or more times day.  Urged mom to post pump and feed back all expressed mothers milk and donor milk as recommended.  Reviewed [facility] Consultation Breastfeeding services handout.  Urged to call lactation as needed.              Odus Clasby S Della Scrivener 06/08/2020, 5:14 PM

## 2020-06-08 NOTE — Progress Notes (Signed)
Patient states that she does not want to take ibuprofen.

## 2020-06-08 NOTE — Progress Notes (Signed)
Patient is doing well.  She is ambulating, voiding, tolerating PO.  Pain control is good.  Lochia is appropriate  Vitals:   06/08/20 0000 06/08/20 0030 06/08/20 0145 06/08/20 0600  BP: (!) 104/62 117/73 (!) 128/95 121/74  Pulse: 76 80 76 72  Resp:  18 18 18   Temp:  98.6 F (37 C)  98.3 F (36.8 C)  TempSrc:  Oral  Oral  SpO2:  99% 99% 99%  Weight:      Height:        NAD Fundus firm Ext: no edema  Lab Results  Component Value Date   WBC 16.2 (H) 06/08/2020   HGB 10.0 (L) 06/08/2020   HCT 30.5 (L) 06/08/2020   MCV 91.3 06/08/2020   PLT 162 06/08/2020    --/--/A POS Performed at [facility], [address], [place], [place]  (08/01 1050)  A/P 29 y.o. G1P1001 PPD#1 s/p TSVD. Routine care.   Expect d/c tomorrow No circ for baby boy.    [facility]

## 2020-06-09 NOTE — Lactation Note (Signed)
This note was copied from a baby's chart. Lactation Consultation Note  Patient Name: Boy Wakeelah Solan [id number] Date: 06/09/2020   Per RN, parents began using formula overnight b/c there was no additional available DBM. I took a bottle of DBM into room from the morning allotment. I will return to room to speak/assist parents further.   Lurline Hare [facility] 06/09/2020, 8:43 AM

## 2020-06-09 NOTE — Discharge Summary (Signed)
Postpartum Discharge Summary     Patient Name: Evelyn Lara DOB: Jul 03, 1991 MRN: [id number]  Date of admission: 06/07/2020 Delivery date:06/07/2020  Delivering provider: Vanessa Kick  Date of discharge: 06/09/2020  Admitting diagnosis: Normal labor and delivery [O80] Spontaneous vaginal delivery [O80] Intrauterine pregnancy: [redacted]w[redacted]d    Secondary diagnosis:  Active Problems:   Normal labor and delivery   Spontaneous vaginal delivery  Additional problems: none    Discharge diagnosis: Term Pregnancy Delivered                                              Post partum procedures:none Augmentation: Pitocin Complications: None  Hospital course: Onset of Labor With Vaginal Delivery      29y.o. yo G1P1001 at 30w6das admitted in Latent Labor on 06/07/2020. Patient had an uncomplicated labor course as follows:  Membrane Rupture Time/Date: 2:22 AM ,06/07/2020   Delivery Method:Vaginal, Spontaneous  Episiotomy: None  Lacerations:  2nd degree  Patient had an uncomplicated postpartum course.  She is ambulating, tolerating a regular diet, passing flatus, and urinating well. Patient is discharged home in stable condition on 06/09/20.  Newborn Data: Birth date:06/07/2020  Birth time:10:30 PM  Gender:Female  Living status:Living  Apgars:9 ,9  [phone]   Magnesium Sulfate received: No BMZ received: No Rhophylac:N/A MMR:N/A T-DaP:see office note Flu: N/A Transfusion:No  Physical exam  Vitals:   06/08/20 0600 06/08/20 1409 06/08/20 2252 06/09/20 0604  BP: 121/74 120/78 118/82 118/80  Pulse: 72 61 69 80  Resp: '18 18 18 16  '$ Temp: 98.3 F (36.8 C) 98.5 F (36.9 C) 98.6 F (37 C) 98.3 F (36.8 C)  TempSrc: Oral Oral Oral Oral  SpO2: 99% 100% 99% 100%  Weight:      Height:       General: alert and cooperative Lochia: appropriate Uterine Fundus: firm DVT Evaluation: No evidence of DVT seen on physical exam. Labs: Lab Results  Component Value Date   WBC 16.2 (H) 06/08/2020    HGB 10.0 (L) 06/08/2020   HCT 30.5 (L) 06/08/2020   MCV 91.3 06/08/2020   PLT 162 06/08/2020   No flowsheet data found. Edinburgh Score: No flowsheet data found.    After visit meds:  Allergies as of 06/09/2020   No Known Allergies     Medication List    TAKE these medications   fluticasone 50 MCG/ACT nasal spray Commonly known as: FLONASE Place 1-2 sprays into both nostrils daily as needed for allergies or rhinitis.   prenatal multivitamin Tabs tablet Take 1 tablet by mouth daily at 12 noon.        Discharge home in stable condition Infant Feeding: Breast Infant Disposition:rooming in Discharge instruction: per After Visit Summary and Postpartum booklet. Activity: Advance as tolerated. Pelvic rest for 6 weeks.  Diet: routine diet Anticipated Birth Control: Unsure Postpartum Appointment:4 weeks Additional Postpartum F/U: Postpartum Depression checkup Future Appointments:No future appointments. Follow up Visit:  Follow-up Information    RoVanessa KickMD Follow up in 4 week(s).   Specialty: Obstetrics and Gynecology Contact information: [phone]                 06/09/2020 SiAllyn KennerDO

## 2020-06-09 NOTE — Lactation Note (Signed)
This note was copied from a baby's chart. Lactation Consultation Note  Patient Name: Evelyn Lara ASTMH'D Date: 06/09/2020   Hand expression was taught to Mom; nipple eversion was noted as a result. Mom's nipple diameter when everted needs a size 24 flange.   Mom reports difficulty in latching. I believe that using the teacup hold will be effective in helping infant to latch. Mom will call for me to return when infant is showing cues. Parents fare interested in the option of supplementing at breast, if needed.    Lurline Hare [facility] 06/09/2020, 9:46 AM

## 2020-06-09 NOTE — Lactation Note (Signed)
This note was copied from a baby's chart. Lactation Consultation Note  Patient Name: Evelyn Lara DXAJO'I Date: 06/09/2020   Latching with teacup hold was attempted, but without success. A size 24 nipple shield was applied and prefilled with a small amount of DBM. Infant latched well. Parents were amenable to supplementing using a 5 Fr tube & syringe. Infant did well. Dad was shown how to wash the parts. Parents would like to try & use this technique at the next feeding.   Lurline Hare [facility] 06/09/2020, 11:41 AM

## 2020-06-10 ENCOUNTER — Ambulatory Visit: Payer: Self-pay

## 2020-06-10 NOTE — Lactation Note (Signed)
This note was copied from a baby's chart. Lactation Consultation Note  Patient Name: Evelyn Lara VWUJW'J Date: 06/10/2020   Infant is 52 hrs old. "Mitzie Na" has gained 61 g. When I entered room, Mom was bottle feeding infant with the Enfamil Extra-Slow flow. Parents report this is the nipple they have been using, not the Nfant Slow Flow as documented.   Mom said she was recently able to hand express 5 mL. Mom reports seeing some bleeding; I will return to reassess. Lab into room to check infant's bili.  Breastfeeding brochure was provided. Mom was made aware of O/P services, breastfeeding support group, and our phone # for post-discharge questions.    Lurline Hare [facility] 06/10/2020, 8:13 AM

## 2020-06-10 NOTE — Lactation Note (Signed)
This note was copied from a baby's chart. Lactation Consultation Note  Patient Name: Evelyn Lara [id number] Date: 06/10/2020   I viewed Mom's R nipple & saw no evidence of bleeding. Mom's R nipple does have some discoloration on the surface; Mom's L nipple has very faint discoloration. This may be b/c of nipple changes now that her nipples are more everted. (Mom has been pumping & wearing shells. Her nipples were able to maintain eversion after removal of shells for the extent of my time in the room [15 minutes]).   Parents' questions were answered about milk coming to volume, supplementing, etc.   I recommended an outpatient appt w/a lactation consultant; an appt request was sent via Epic.   Lurline Hare [facility] 06/10/2020, 12:38 PM

## 2020-06-11 ENCOUNTER — Ambulatory Visit: Payer: Self-pay

## 2020-06-11 NOTE — Lactation Note (Signed)
This note was copied from a baby's chart. Lactation Consultation Note  Patient Name: Evelyn Lara XIPJA'S Date: 06/11/2020 Reason for consult: Follow-up assessment;Term  P1 mother whose infant is now 52 hours old.  This is a term baby at 39+6 weeks and he is SGA.   Bilirubin level was 12.2 mg/dl at 80 hours of life.  Baby will have a repeat bilirubin level drawn at 1400.  Parents are hoping for a discharge today.  Baby was swaddled and asleep in the bassinet when I arrived.  Mother was excited to report that this last breast feeding was the first time she did not have to supplement her baby.  Her breasts are feeling heavier and fuller this morning.  Engorgement prevention/treatment discussed.  Mother has a manual pump and a DEBP for home use.    Suggested mother feed baby one more time prior to lab arriving for specimen draw.  Mother will plan on doing this.  She has 18 mls of EBM at bedside which he will also consume after breast feeding.    Mother will continue to feed 8-12 times/24 hours or sooner if baby shows cues.  She will continue to pump and supplement with her own EBM.  Mother stated that he has a big appetite for his size.  Praised mother for her continued efforts to breast feed, pump and supplement.  She also has an OP LC visit scheduled for after discharge.  Explained what her OP LC visit will entail and suggested she keep this appointment even if she feels like baby is latching and feeding well; the extra reassurance will be beneficial.  Mother agreed.  Father present and supportive.  Mother will call for assistance if needed throughout the day.   Maternal Data    Feeding Feeding Type: Breast Fed Nipple Type: Slow - flow  LATCH Score Latch: Repeated attempts needed to sustain latch, nipple held in mouth throughout feeding, stimulation needed to elicit sucking reflex.  Audible Swallowing: Spontaneous and intermittent  Type of Nipple: Everted at rest and after  stimulation  Comfort (Breast/Nipple): Soft / non-tender  Hold (Positioning): No assistance needed to correctly position infant at breast.  LATCH Score: 9  Interventions    Lactation Tools Discussed/Used     Consult Status Consult Status: Complete Date: 06/11/20 Follow-up type: Call as needed    Macguire Holsinger R Dajsha Massaro 06/11/2020, 11:55 AM

## 2020-06-22 ENCOUNTER — Encounter (HOSPITAL_COMMUNITY)
Admission: AD | Disposition: A | Discharge status: Home / Self Care | Payer: Self-pay | Source: Home / Self Care | Attending: Obstetrics and Gynecology

## 2020-06-22 ENCOUNTER — Inpatient Hospital Stay (HOSPITAL_COMMUNITY): Payer: BC Managed Care – PPO | Admitting: Registered Nurse

## 2020-06-22 ENCOUNTER — Inpatient Hospital Stay (HOSPITAL_COMMUNITY): Payer: BC Managed Care – PPO

## 2020-06-22 ENCOUNTER — Other Ambulatory Visit: Payer: Self-pay

## 2020-06-22 ENCOUNTER — Inpatient Hospital Stay (HOSPITAL_COMMUNITY)
Admission: AD | Admit: 2020-06-22 | Discharge: 2020-06-22 | Disposition: A | Discharge status: Home / Self Care | Payer: BC Managed Care – PPO | Attending: Obstetrics and Gynecology | Admitting: Obstetrics and Gynecology

## 2020-06-22 DIAGNOSIS — Z20822 Contact with and (suspected) exposure to covid-19: Secondary | ICD-10-CM | POA: Insufficient documentation

## 2020-06-22 DIAGNOSIS — N939 Abnormal uterine and vaginal bleeding, unspecified: Secondary | ICD-10-CM | POA: Diagnosis not present

## 2020-06-22 HISTORY — PX: DILATION AND EVACUATION: SHX1459

## 2020-06-22 LAB — CBC WITH DIFFERENTIAL/PLATELET
Abs Immature Granulocytes: 0.02 10*3/uL (ref 0.00–0.07)
Basophils Absolute: 0.1 10*3/uL (ref 0.0–0.1)
Basophils Relative: 1 %
Eosinophils Absolute: 0.2 10*3/uL (ref 0.0–0.5)
Eosinophils Relative: 3 %
HCT: 34.8 % — ABNORMAL LOW (ref 36.0–46.0)
Hemoglobin: 10.9 g/dL — ABNORMAL LOW (ref 12.0–15.0)
Immature Granulocytes: 0 %
Lymphocytes Relative: 40 %
Lymphs Abs: 2.8 10*3/uL (ref 0.7–4.0)
MCH: 29.1 pg (ref 26.0–34.0)
MCHC: 31.3 g/dL (ref 30.0–36.0)
MCV: 92.8 fL (ref 80.0–100.0)
Monocytes Absolute: 0.4 10*3/uL (ref 0.1–1.0)
Monocytes Relative: 6 %
Neutro Abs: 3.4 10*3/uL (ref 1.7–7.7)
Neutrophils Relative %: 50 %
Platelets: 276 10*3/uL (ref 150–400)
RBC: 3.75 MIL/uL — ABNORMAL LOW (ref 3.87–5.11)
RDW: 13.3 % (ref 11.5–15.5)
WBC: 6.9 10*3/uL (ref 4.0–10.5)
nRBC: 0 % (ref 0.0–0.2)

## 2020-06-22 LAB — COMPREHENSIVE METABOLIC PANEL
ALT: 14 U/L (ref 0–44)
AST: 19 U/L (ref 15–41)
Albumin: 3.2 g/dL — ABNORMAL LOW (ref 3.5–5.0)
Alkaline Phosphatase: 74 U/L (ref 38–126)
Anion gap: 9 (ref 5–15)
BUN: 14 mg/dL (ref 6–20)
CO2: 27 mmol/L (ref 22–32)
Calcium: 9.1 mg/dL (ref 8.9–10.3)
Chloride: 104 mmol/L (ref 98–111)
Creatinine, Ser: 0.63 mg/dL (ref 0.44–1.00)
GFR calc Af Amer: >60 mL/min (ref >60)
GFR calc non Af Amer: >60 mL/min (ref >60)
Glucose, Bld: 87 mg/dL (ref 70–99)
Potassium: 3.8 mmol/L (ref 3.5–5.1)
Sodium: 140 mmol/L (ref 135–145)
Total Bilirubin: 0.5 mg/dL (ref 0.3–1.2)
Total Protein: 6.2 g/dL — ABNORMAL LOW (ref 6.5–8.1)

## 2020-06-22 LAB — TYPE AND SCREEN
ABO/RH(D): A POS
Antibody Screen: NEGATIVE

## 2020-06-22 LAB — SARS CORONAVIRUS 2 BY RT PCR (HOSPITAL ORDER, PERFORMED IN ~~LOC~~ HOSPITAL LAB): SARS Coronavirus 2: NEGATIVE

## 2020-06-22 SURGERY — DILATION AND EVACUATION, UTERUS
Anesthesia: General | Site: Vagina

## 2020-06-22 MED ORDER — PROMETHAZINE HCL 25 MG/ML IJ SOLN: 6.2500 mg | INTRAMUSCULAR | Status: DC | PRN

## 2020-06-22 MED ORDER — MISOPROSTOL 200 MCG PO TABS
ORAL_TABLET | ORAL | Status: AC
  Filled 2020-06-22: qty 5

## 2020-06-22 MED ORDER — ACETAMINOPHEN 10 MG/ML IV SOLN: 1000.0000 mg | Freq: Once | INTRAVENOUS | Status: DC | PRN

## 2020-06-22 MED ORDER — SUCCINYLCHOLINE CHLORIDE 200 MG/10ML IV SOSY
PREFILLED_SYRINGE | INTRAVENOUS | Status: DC | PRN
  Administered 2020-06-22: 100 mg via INTRAVENOUS

## 2020-06-22 MED ORDER — CHLORHEXIDINE GLUCONATE 0.12 % MT SOLN: 15.0000 mL | Freq: Once | OROMUCOSAL | Status: AC

## 2020-06-22 MED ORDER — LIDOCAINE 2% (20 MG/ML) 5 ML SYRINGE
INTRAMUSCULAR | Status: DC | PRN
  Administered 2020-06-22: 60 mg via INTRAVENOUS

## 2020-06-22 MED ORDER — DEXAMETHASONE SODIUM PHOSPHATE 10 MG/ML IJ SOLN
INTRAMUSCULAR | Status: DC | PRN
  Administered 2020-06-22: 5 mg via INTRAVENOUS

## 2020-06-22 MED ORDER — PROPOFOL 10 MG/ML IV BOLUS
INTRAVENOUS | Status: DC | PRN
  Administered 2020-06-22: 150 mg via INTRAVENOUS

## 2020-06-22 MED ORDER — METOCLOPRAMIDE HCL 10 MG PO TABS: 10.0000 mg | ORAL_TABLET | Freq: Four times a day (QID) | ORAL | 1 refills | Status: AC

## 2020-06-22 MED ORDER — FENTANYL CITRATE (PF) 250 MCG/5ML IJ SOLN
INTRAMUSCULAR | Status: DC | PRN
  Administered 2020-06-22: 100 ug via INTRAVENOUS

## 2020-06-22 MED ORDER — IBUPROFEN 600 MG PO TABS
600.0000 mg | ORAL_TABLET | Freq: Four times a day (QID) | ORAL | 0 refills | Status: AC | PRN
Start: 2020-06-22 — End: ?

## 2020-06-22 MED ORDER — ACETAMINOPHEN 500 MG PO TABS
ORAL_TABLET | ORAL | Status: AC
  Filled 2020-06-22: qty 2

## 2020-06-22 MED ORDER — MIDAZOLAM HCL 5 MG/5ML IJ SOLN
INTRAMUSCULAR | Status: DC | PRN
  Administered 2020-06-22: 2 mg via INTRAVENOUS

## 2020-06-22 MED ORDER — LIDOCAINE HCL 1 % IJ SOLN
INTRAMUSCULAR | Status: DC | PRN
  Administered 2020-06-22 (×2): 10 mL

## 2020-06-22 MED ORDER — ONDANSETRON HCL 4 MG/2ML IJ SOLN
INTRAMUSCULAR | Status: DC | PRN
  Administered 2020-06-22: 4 mg via INTRAVENOUS

## 2020-06-22 MED ORDER — PROPOFOL 10 MG/ML IV BOLUS
INTRAVENOUS | Status: AC
  Filled 2020-06-22: qty 20

## 2020-06-22 MED ORDER — LIDOCAINE HCL 1 % IJ SOLN
INTRAMUSCULAR | Status: AC
  Filled 2020-06-22: qty 20

## 2020-06-22 MED ORDER — DIPHENHYDRAMINE HCL 50 MG/ML IJ SOLN
INTRAMUSCULAR | Status: DC | PRN
  Administered 2020-06-22: 6.25 mg via INTRAVENOUS

## 2020-06-22 MED ORDER — CHLORHEXIDINE GLUCONATE 0.12 % MT SOLN
OROMUCOSAL | Status: AC
  Administered 2020-06-22: 15 mL via OROMUCOSAL
  Filled 2020-06-22: qty 15

## 2020-06-22 MED ORDER — MIDAZOLAM HCL 2 MG/2ML IJ SOLN
INTRAMUSCULAR | Status: AC
  Filled 2020-06-22: qty 2

## 2020-06-22 MED ORDER — HYDROCODONE-ACETAMINOPHEN 5-325 MG PO TABS: ORAL_TABLET | ORAL | 0 refills | Status: AC

## 2020-06-22 MED ORDER — MISOPROSTOL 100 MCG PO TABS
ORAL_TABLET | ORAL | Status: DC | PRN
  Administered 2020-06-22: 1000 ug via RECTAL

## 2020-06-22 MED ORDER — LACTATED RINGERS IV SOLN: INTRAVENOUS | Status: DC

## 2020-06-22 MED ORDER — FENTANYL CITRATE (PF) 100 MCG/2ML IJ SOLN: 25.0000 ug | INTRAMUSCULAR | Status: DC | PRN

## 2020-06-22 MED ORDER — FENTANYL CITRATE (PF) 250 MCG/5ML IJ SOLN
INTRAMUSCULAR | Status: AC
  Filled 2020-06-22: qty 5

## 2020-06-22 SURGICAL SUPPLY — 23 items
CATH ROBINSON RED A/P 16FR (CATHETERS) ×3 IMPLANT
DECANTER SPIKE VIAL GLASS SM (MISCELLANEOUS) ×3 IMPLANT
FILTER UTR ASPR ASSEMBLY (MISCELLANEOUS) ×3 IMPLANT
GLOVE BIOGEL PI IND STRL 7.0 (GLOVE) ×1 IMPLANT
GLOVE BIOGEL PI INDICATOR 7.0 (GLOVE) ×2
GLOVE ECLIPSE 7.0 STRL STRAW (GLOVE) ×6 IMPLANT
GOWN STRL REUS W/ TWL LRG LVL3 (GOWN DISPOSABLE) ×3 IMPLANT
GOWN STRL REUS W/TWL LRG LVL3 (GOWN DISPOSABLE) ×6
HOSE CONNECTING 18IN BERKELEY (TUBING) ×3 IMPLANT
KIT BERKELEY 1ST TRI 3/8 NO TR (MISCELLANEOUS) ×3 IMPLANT
KIT BERKELEY 1ST TRIMESTER 3/8 (MISCELLANEOUS) ×3 IMPLANT
NS IRRIG 1000ML POUR BTL (IV SOLUTION) ×3 IMPLANT
PACK VAGINAL MINOR WOMEN LF (CUSTOM PROCEDURE TRAY) ×3 IMPLANT
PAD OB MATERNITY 4.3X12.25 (PERSONAL CARE ITEMS) ×3 IMPLANT
SET BERKELEY SUCTION TUBING (SUCTIONS) ×3 IMPLANT
SUT VIC AB 3-0 SH 27 (SUTURE) ×2
SUT VIC AB 3-0 SH 27X BRD (SUTURE) ×1 IMPLANT
TOWEL GREEN STERILE FF (TOWEL DISPOSABLE) ×6 IMPLANT
UNDERPAD 30X36 HEAVY ABSORB (UNDERPADS AND DIAPERS) ×3 IMPLANT
VACURETTE 10 RIGID CVD (CANNULA) IMPLANT
VACURETTE 7MM CVD STRL WRAP (CANNULA) ×3 IMPLANT
VACURETTE 8 RIGID CVD (CANNULA) IMPLANT
VACURETTE 9 RIGID CVD (CANNULA) IMPLANT

## 2020-06-22 NOTE — MAU Provider Note (Signed)
History     CSN: [id number]  Arrival date and time: 06/22/20 0056   First Provider Initiated Contact with Patient 06/22/20 0129      Chief Complaint  Patient presents with  . Vaginal Bleeding   HPI Evelyn Lara is a 29 y.o. G1P1001 postpartum from a vaginal delivery on 8/1 who presents with vaginal bleeding. She states for the last 3-4 days her bleeding had slowed to light pink and white discharge. She states today the bleeding started to slowly increase and by midnight, she had soaked 2-3 large pads in less than an hour. She also is passing quarter sized clots. She denies any abdominal pain. She states she has had a dull headache since delivery but this is not new. She denies any dizziness or light headedness.   OB History    Gravida  1   Para  1   Term  1   Preterm      AB      Living  1     SAB      TAB      Ectopic      Multiple  0   Live Births  1           Past Medical History:  Diagnosis Date  . Anemia     Past Surgical History:  Procedure Laterality Date  . CHOLECYSTECTOMY    . TONSILLECTOMY    . WISDOM TOOTH EXTRACTION      Family History  Problem Relation Age of Onset  . Drug abuse Father   . COPD Father   . Diabetes Father   . Heart disease Father   . Diabetes Brother   . Arthritis Maternal Grandmother   . Asthma Maternal Grandmother   . Diabetes Maternal Grandmother   . Heart disease Maternal Grandmother   . Hyperlipidemia Maternal Grandmother   . Diabetes Paternal Grandmother   . Cancer Paternal Grandfather     Social History   Tobacco Use  . Smoking status: Never Smoker  . Smokeless tobacco: Never Used  Vaping Use  . Vaping Use: Never used  Substance Use Topics  . Alcohol use: Not Currently  . Drug use: Never    Allergies: Not on File  Medications Prior to Admission  Medication Sig Dispense Refill Last Dose  . acetaminophen (TYLENOL) 500 MG tablet Take 500 mg by mouth every 6 (six) hours as needed for mild  pain.   06/21/2020 at 0800  . fluticasone (FLONASE) 50 MCG/ACT nasal spray Place 1-2 sprays into both nostrils daily as needed for allergies or rhinitis.     . Prenatal Vit-Fe Fumarate-FA (PRENATAL MULTIVITAMIN) TABS tablet Take 1 tablet by mouth daily at 12 noon.       Review of Systems  Constitutional: Negative.  Negative for fatigue and fever.  HENT: Negative.   Respiratory: Negative.  Negative for shortness of breath.   Cardiovascular: Negative.  Negative for chest pain.  Gastrointestinal: Negative.  Negative for abdominal pain, constipation, diarrhea, nausea and vomiting.  Genitourinary: Positive for vaginal bleeding. Negative for dysuria and vaginal discharge.  Neurological: Negative.  Negative for dizziness and headaches.   Physical Exam   Blood pressure (!) 134/95, pulse 74, temperature 98.3 F (36.8 C), temperature source Oral, resp. rate 16, weight 75.3 kg, SpO2 100 %, unknown if currently breastfeeding.  Patient Vitals for the past 24 hrs:  BP Temp Temp src Pulse Resp SpO2 Weight  06/22/20 0129 115/79 98.2 F (36.8 C) Oral 61 18 99 % --  06/22/20 0106 (!) 134/95 98.3 F (36.8 C) Oral 74 16 100 % 75.3 kg     Physical Exam Vitals and nursing note reviewed.  Constitutional:      General: She is not in acute distress.    Appearance: She is well-developed.  HENT:     Head: Normocephalic.  Eyes:     Pupils: Pupils are equal, round, and reactive to light.  Cardiovascular:     Rate and Rhythm: Normal rate and regular rhythm.     Heart sounds: Normal heart sounds.  Pulmonary:     Effort: Pulmonary effort is normal. No respiratory distress.     Breath sounds: Normal breath sounds.  Abdominal:     General: Bowel sounds are normal. There is no distension.     Palpations: Abdomen is soft.     Tenderness: There is no abdominal tenderness.  Genitourinary:    Comments: SSE: small to moderate amount of bright red bleeding in vault with one grape sized clot. Uterus enlarged to  10 week size.  Skin:    General: Skin is warm and dry.  Neurological:     Mental Status: She is alert and oriented to person, place, and time.  Psychiatric:        Behavior: Behavior normal.        Thought Content: Thought content normal.        Judgment: Judgment normal.     MAU Course  Procedures Results for orders placed or performed during the hospital encounter of 06/22/20 (from the past 24 hour(s))  CBC with Differential/Platelet     Status: Abnormal   Collection Time: 06/22/20  2:07 AM  Result Value Ref Range   WBC 6.9 4.0 - 10.5 K/uL   RBC 3.75 (L) 3.87 - 5.11 MIL/uL   Hemoglobin 10.9 (L) 12.0 - 15.0 g/dL   HCT 24.4 (L) 36 - 46 %   MCV 92.8 80.0 - 100.0 fL   MCH 29.1 26.0 - 34.0 pg   MCHC 31.3 30.0 - 36.0 g/dL   RDW 01.0 27.2 - 53.6 %   Platelets 276 150 - 400 K/uL   nRBC 0.0 0.0 - 0.2 %   Neutrophils Relative % 50 %   Neutro Abs 3.4 1.7 - 7.7 K/uL   Lymphocytes Relative 40 %   Lymphs Abs 2.8 0.7 - 4.0 K/uL   Monocytes Relative 6 %   Monocytes Absolute 0.4 0 - 1 K/uL   Eosinophils Relative 3 %   Eosinophils Absolute 0.2 0 - 0 K/uL   Basophils Relative 1 %   Basophils Absolute 0.1 0 - 0 K/uL   Immature Granulocytes 0 %   Abs Immature Granulocytes 0.02 0.00 - 0.07 K/uL  Comprehensive metabolic panel     Status: Abnormal   Collection Time: 06/22/20  2:07 AM  Result Value Ref Range   Sodium 140 135 - 145 mmol/L   Potassium 3.8 3.5 - 5.1 mmol/L   Chloride 104 98 - 111 mmol/L   CO2 27 22 - 32 mmol/L   Glucose, Bld 87 70 - 99 mg/dL   BUN 14 6 - 20 mg/dL   Creatinine, Ser 6.44 0.44 - 1.00 mg/dL   Calcium 9.1 8.9 - 03.4 mg/dL   Total Protein 6.2 (L) 6.5 - 8.1 g/dL   Albumin 3.2 (L) 3.5 - 5.0 g/dL   AST 19 15 - 41 U/L   ALT 14 0 - 44 U/L   Alkaline Phosphatase 74 38 - 126 U/L   Total  Bilirubin 0.5 0.3 - 1.2 mg/dL   GFR calc non Af Amer >60 >60 mL/min   GFR calc Af Amer >60 >60 mL/min   Anion gap 9 5 - 15   US Pelvis Complete  Result Date:  06/22/2020 CLINICAL DATA:  Postpartum bleeding EXAM: TRANSABDOMINAL ULTRASOUND OF PELVIS TECHNIQUE: Transabdominal ultrasound examination of the pelvis was performed including evaluation of the uterus, ovaries, adnexal regions, and pelvic cul-de-sac. COMPARISON:  None. FINDINGS: Uterus Measurements: 12 x 6.5 x 7.6 cm = volume: 307.4 mL. No fibroids or other mass visualized. Endometrium Thickness: 22 mm. Heterogenous thickening. Focal areas of increased vascularity. Right ovary Measurements: 3.4 x 1.1 x 1.9 cm = volume: 3.8 mL. Normal appearance/no adnexal mass. Left ovary Not seen Other findings:  No abnormal free fluid. IMPRESSION: Endometrial thickness of 22 mm with increased vascularity. Sonographic findings are compatible with retained products of conception in the appropriate clinical setting. Electronically Signed   By: Jasmine Pang M.D.   On: 06/22/2020 02:23    MDM CBC with Diff CMP US Pelvis Comp  Initial BP elevated but remaining normal. No headache now, visual changes or epigastric pain.  Consulted with Dr. Vergie Living regarding presentation and ultrasound results- recommends surgery for removal of retained POC  Report given to Dr. Dareen Piano- will collect COVID swab and type and screen and prep patient for D&E  Assessment and Plan  -Retained products of conception postpartum  -Patient to remain NPO -COVID swab -IV placed -Care turned over to MD  Rolm Bookbinder CNM 06/22/2020, 1:29 AM

## 2020-06-22 NOTE — H&P (Signed)
Evelyn Lara is an 29 y.o. G1P1001 white female who presents to the ER c/o a sudden onset of heavy vaginal bleeding. She is now one week PP. She states that the bleeding began 2200 8/15. It was heavy until 0030 today. She was worked up by the ER. She had an u/s which indicated that there may be placenta in the uterus. There was a 2.2cm area of thickening with flow. She is currently not having any heavy bleeding. She has a neg covid. Her hgb is 10.9  Past Medical History:  Diagnosis Date  . Anemia     Past Surgical History:  Procedure Laterality Date  . CHOLECYSTECTOMY    . TONSILLECTOMY    . WISDOM TOOTH EXTRACTION      Family History  Problem Relation Age of Onset  . Drug abuse Father   . COPD Father   . Diabetes Father   . Heart disease Father   . Diabetes Brother   . Arthritis Maternal Grandmother   . Asthma Maternal Grandmother   . Diabetes Maternal Grandmother   . Heart disease Maternal Grandmother   . Hyperlipidemia Maternal Grandmother   . Diabetes Paternal Grandmother   . Cancer Paternal Grandfather    Social History:  reports that she has never smoked. She has never used smokeless tobacco. She reports previous alcohol use. She reports that she does not use drugs.  Allergies: Not on File  Medications Prior to Admission  Medication Sig Dispense Refill  . acetaminophen (TYLENOL) 500 MG tablet Take 500 mg by mouth every 6 (six) hours as needed for mild pain.    . fluticasone (FLONASE) 50 MCG/ACT nasal spray Place 1-2 sprays into both nostrils daily as needed for allergies or rhinitis.    . Prenatal Vit-Fe Fumarate-FA (PRENATAL MULTIVITAMIN) TABS tablet Take 1 tablet by mouth daily at 12 noon.         Blood pressure 109/67, pulse (!) 53, temperature 98.1 F (36.7 C), temperature source Oral, resp. rate 18, weight 75.3 kg, SpO2 99 %, unknown if currently breastfeeding. General appearance: alert and cooperative Abdomen: non distended, non tender   Lab Results   Component Value Date   WBC 6.9 06/22/2020   HGB 10.9 (L) 06/22/2020   HCT 34.8 (L) 06/22/2020   MCV 92.8 06/22/2020   PLT 276 06/22/2020   No results found for: PREGTESTUR, PREGSERUM, HCG, HCGQUANT    Patient Active Problem List   Diagnosis Date Noted  . Spontaneous vaginal delivery 06/08/2020  . Normal labor and delivery 06/07/2020   IMP/ S/P SVD, Onset of heavy vaginal bleeding.         Possible retained placenta Plan/ Will proceed to OR for D&E  Kinte Trim E 06/22/2020, 7:44 AM

## 2020-06-22 NOTE — MAU Note (Signed)
[name] is a 30 y.o. at Unknown here in MAU reporting: increased VB after delivery pt reports that bleeding slowed down and changed to white bleeding for 3-4 days then today she started spotting and bleeding to the point she was soaking 2-3 in two hours, with clots-quarter sized  Onset of complaint: today Pain score: 0 Vitals:   06/22/20 0106  BP: (!) 134/95  Pulse: 74  Resp: 16  Temp: 98.3 F (36.8 C)  SpO2: 100%     :

## 2020-06-22 NOTE — Progress Notes (Signed)
Pt transported to Short Stay by transporter.

## 2020-06-22 NOTE — Progress Notes (Signed)
Patient ID: Evelyn Lara, female   DOB: [birth year], 29 y.o.   MRN: [id number]  I have assumed care of this patient will be taking over for Dr. Dareen Piano.  I will be performing her D&E.  I discussed the case with the patient, risks/benefits/alternatives.  She wishes to proceed.  Informed consent obtained.  Additionally, patient questioned the use of metoclopramide for decreased milk supply.  Discussed risks/benefits/alternatives of metoclopramide and breastmilk production.  Discussed alternative options to improve milk supply.  Will provide patient prescription at the time of discharge.

## 2020-06-22 NOTE — Op Note (Signed)
Pre-Operative Diagnosis: 1) retained products of conception Postoperative Diagnosis: 1) retained products of conception versus placental insertion site eschar 2) repair of disrupted obstetric laceration Procedure: Suction dilation and evacuation and repair of second-degree perineal laceration Surgeon: Dr. Waynard Reeds Assistant: None Operative Findings: 8 to 10 weeks size uterus.  Minimal residual tissue removed with suction.  With removal of the speculum after the D&E procedure it was noted that the second-degree perineal laceration repair from her delivery had been disrupted.  Therefore this was repaired Specimen: POC's to pathology EBL: Total I/O In: 600 [I.V.:600] Out: 550 [Urine:500; Blood:50]   Ms. Evelyn Lara Is a 29 year old gravida 1 para 1 who presents for definitive surgical management for retained products of conception. Please see the patient's history and physical for complete details of the history. Management options were discussed with the patient. R/B/A reviewed. Following appropriate informed consent was taken to the operating room. The patient was appropriately identified during a time out procedure. General anesthesia was administered and the patient was placed in the dorsal lithotomy position. The patient was prepped and draped in the normal sterile fashion. A speculum was placed into the vagina, a single-tooth tenaculum was placed on the anterior lip of the cervix, and 10 cc of 1% lidocaine was administered in a paracervical fashion. The cervix was serially dilated with Shawnie Pons dilators. . A #7 suction curet was then passed to the fundus, the vacuum was engaged, and 3 suction passes were performed with a curette. This completed the suction D&E procedure.  Minimal tissue was removed during the suction D and E portion of the case and the decision was made to place misoprostol 1000 mcg per vagina to combat any uterine atony post procedure.  With removal of the speculum it was noted that the  patient's prior repaired second-degree perineal laceration had been disrupted.  This was repaired with 3-0 Vicryl in the appropriate fashion.  1% lidocaine 10 cc was injected into the perineum following repair.  All sponge, lap, needle counts were correct.  The patient was transferred to the PACU in stable condition following the procedure.

## 2020-06-22 NOTE — Anesthesia Preprocedure Evaluation (Signed)
Anesthesia Evaluation  Patient identified by MRN, date of birth, ID band Patient awake    Reviewed: Allergy & Precautions, NPO status , Patient's Chart, lab work & pertinent test results  Airway Mallampati: II  TM Distance: >3 FB Neck ROM: Full    Dental  (+) Dental Advisory Given   Pulmonary neg pulmonary ROS,    breath sounds clear to auscultation       Cardiovascular negative cardio ROS   Rhythm:Regular Rate:Normal     Neuro/Psych negative neurological ROS     GI/Hepatic negative GI ROS, Neg liver ROS,   Endo/Other  negative endocrine ROS  Renal/GU negative Renal ROS     Musculoskeletal   Abdominal   Peds  Hematology  (+) anemia ,   Anesthesia Other Findings   Reproductive/Obstetrics 1 week pp with bleeding                             Anesthesia Physical Anesthesia Plan  ASA: II and emergent  Anesthesia Plan: General   Post-op Pain Management:    Induction: Intravenous  PONV Risk Score and Plan: 3 and Dexamethasone, Ondansetron and Treatment may vary due to age or medical condition  Airway Management Planned: Oral ETT  Additional Equipment: None  Intra-op Plan:   Post-operative Plan: Extubation in OR  Informed Consent: I have reviewed the patients History and Physical, chart, labs and discussed the procedure including the risks, benefits and alternatives for the proposed anesthesia with the patient or authorized representative who has indicated his/her understanding and acceptance.     Dental advisory given  Plan Discussed with: CRNA  Anesthesia Plan Comments:         Anesthesia Quick Evaluation

## 2020-06-22 NOTE — Transfer of Care (Signed)
Immediate Anesthesia Transfer of Care Note  Patient: Evelyn Lara  Procedure(s) Performed: DILATATION AND EVACUATION WITH REPAIR OF DISRUPTED PERINEAL LACERATION (N/A Vagina )  Patient Location: PACU  Anesthesia Type:General  Level of Consciousness: awake and drowsy  Airway & Oxygen Therapy: Patient Spontanous Breathing  Post-op Assessment: Report given to RN and Post -op Vital signs reviewed and stable  Post vital signs: Reviewed and stable  Last Vitals:  Vitals Value Taken Time  BP 113/93 06/22/20 0955  Temp    Pulse 79 06/22/20 0957  Resp 25 06/22/20 0957  SpO2 99 % 06/22/20 0957  Vitals shown include unvalidated device data.  Last Pain:  Vitals:   06/22/20 0732  TempSrc: Oral  PainSc: 0-No pain      Patients Stated Pain Goal: 2 (06/22/20 0131)  Complications: No complications documented.

## 2020-06-22 NOTE — Anesthesia Procedure Notes (Signed)
Procedure Name: Intubation Date/Time: 06/22/2020 9:13 AM Performed by: Trinna Post., CRNA Pre-anesthesia Checklist: Patient identified, Emergency Drugs available, Suction available, Patient being monitored and Timeout performed Patient Re-evaluated:Patient Re-evaluated prior to induction Oxygen Delivery Method: Circle system utilized Preoxygenation: Pre-oxygenation with 100% oxygen Induction Type: IV induction, Rapid sequence and Cricoid Pressure applied Laryngoscope Size: Mac and 3 Grade View: Grade I Tube type: Oral Tube size: 7.0 mm Number of attempts: 1 Airway Equipment and Method: Stylet Placement Confirmation: ETT inserted through vocal cords under direct vision,  positive ETCO2 and breath sounds checked- equal and bilateral Secured at: 21 cm Tube secured with: Tape Dental Injury: Teeth and Oropharynx as per pre-operative assessment

## 2020-06-22 NOTE — Anesthesia Postprocedure Evaluation (Signed)
Anesthesia Post Note  Patient: Evelyn Lara  Procedure(s) Performed: DILATATION AND EVACUATION WITH REPAIR OF DISRUPTED PERINEAL LACERATION (N/A Vagina )     Patient location during evaluation: PACU Anesthesia Type: General Level of consciousness: awake and alert Pain management: pain level controlled Vital Signs Assessment: post-procedure vital signs reviewed and stable Respiratory status: spontaneous breathing, nonlabored ventilation, respiratory function stable and patient connected to nasal cannula oxygen Cardiovascular status: blood pressure returned to baseline and stable Postop Assessment: no apparent nausea or vomiting Anesthetic complications: no   No complications documented.  Last Vitals:  Vitals:   06/22/20 1030 06/22/20 1042  BP: 125/81 (!) 127/95  Pulse: (!) 59 (!) 56  Resp: 17 13  Temp:  36.6 C  SpO2: 97% 97%    Last Pain:  Vitals:   06/22/20 1042  TempSrc:   PainSc: 4                  Kennieth Rad

## 2020-06-23 ENCOUNTER — Encounter (HOSPITAL_COMMUNITY): Payer: Self-pay | Admitting: Obstetrics and Gynecology

## 2020-06-23 LAB — SURGICAL PATHOLOGY

## 2020-07-16 DIAGNOSIS — F4323 Adjustment disorder with mixed anxiety and depressed mood: Secondary | ICD-10-CM | POA: Diagnosis not present

## 2020-07-23 DIAGNOSIS — F4323 Adjustment disorder with mixed anxiety and depressed mood: Secondary | ICD-10-CM | POA: Diagnosis not present

## 2020-07-31 DIAGNOSIS — O99345 Other mental disorders complicating the puerperium: Secondary | ICD-10-CM | POA: Diagnosis not present

## 2020-07-31 DIAGNOSIS — F53 Postpartum depression: Secondary | ICD-10-CM | POA: Diagnosis not present

## 2020-08-07 DIAGNOSIS — F4323 Adjustment disorder with mixed anxiety and depressed mood: Secondary | ICD-10-CM | POA: Diagnosis not present

## 2020-08-13 DIAGNOSIS — F4323 Adjustment disorder with mixed anxiety and depressed mood: Secondary | ICD-10-CM | POA: Diagnosis not present

## 2020-08-19 DIAGNOSIS — F53 Postpartum depression: Secondary | ICD-10-CM | POA: Diagnosis not present

## 2020-08-20 DIAGNOSIS — F4323 Adjustment disorder with mixed anxiety and depressed mood: Secondary | ICD-10-CM | POA: Diagnosis not present

## 2020-08-26 DIAGNOSIS — Z30430 Encounter for insertion of intrauterine contraceptive device: Secondary | ICD-10-CM | POA: Diagnosis not present

## 2020-08-26 DIAGNOSIS — Z3202 Encounter for pregnancy test, result negative: Secondary | ICD-10-CM | POA: Diagnosis not present

## 2020-08-26 DIAGNOSIS — Z6822 Body mass index (BMI) 22.0-22.9, adult: Secondary | ICD-10-CM | POA: Diagnosis not present

## 2020-09-01 DIAGNOSIS — F4323 Adjustment disorder with mixed anxiety and depressed mood: Secondary | ICD-10-CM | POA: Diagnosis not present

## 2020-09-14 DIAGNOSIS — Z0000 Encounter for general adult medical examination without abnormal findings: Secondary | ICD-10-CM | POA: Diagnosis not present

## 2020-09-14 DIAGNOSIS — Z23 Encounter for immunization: Secondary | ICD-10-CM | POA: Diagnosis not present

## 2020-09-15 DIAGNOSIS — F4323 Adjustment disorder with mixed anxiety and depressed mood: Secondary | ICD-10-CM | POA: Diagnosis not present

## 2020-09-29 DIAGNOSIS — F4323 Adjustment disorder with mixed anxiety and depressed mood: Secondary | ICD-10-CM | POA: Diagnosis not present

## 2020-10-11 DIAGNOSIS — M79671 Pain in right foot: Secondary | ICD-10-CM | POA: Diagnosis not present

## 2020-10-19 DIAGNOSIS — F4323 Adjustment disorder with mixed anxiety and depressed mood: Secondary | ICD-10-CM | POA: Diagnosis not present

## 2020-10-21 DIAGNOSIS — Z30431 Encounter for routine checking of intrauterine contraceptive device: Secondary | ICD-10-CM | POA: Diagnosis not present

## 2020-10-21 DIAGNOSIS — Z6823 Body mass index (BMI) 23.0-23.9, adult: Secondary | ICD-10-CM | POA: Diagnosis not present

## 2020-10-22 ENCOUNTER — Other Ambulatory Visit: Payer: Self-pay

## 2020-10-22 ENCOUNTER — Ambulatory Visit (INDEPENDENT_AMBULATORY_CARE_PROVIDER_SITE_OTHER): Payer: BC Managed Care – PPO | Admitting: Podiatry

## 2020-10-22 ENCOUNTER — Encounter: Payer: Self-pay | Admitting: Podiatry

## 2020-10-22 ENCOUNTER — Ambulatory Visit (INDEPENDENT_AMBULATORY_CARE_PROVIDER_SITE_OTHER): Payer: BC Managed Care – PPO

## 2020-10-22 DIAGNOSIS — M79671 Pain in right foot: Secondary | ICD-10-CM

## 2020-10-22 DIAGNOSIS — S92314A Nondisplaced fracture of first metatarsal bone, right foot, initial encounter for closed fracture: Secondary | ICD-10-CM

## 2020-10-22 DIAGNOSIS — M779 Enthesopathy, unspecified: Secondary | ICD-10-CM

## 2020-10-26 NOTE — Progress Notes (Signed)
Subjective:   Patient ID: Evelyn Lara, female   DOB: 29 y.o.   MRN: [id number]   HPI Patient presents stating she started about a lot of right foot pain and that it was swelling and bruising and she went to urgent care and they check x-rays did not see anything at that time.  States it is hard for her to walk on and she is not sure what might of happened she had a boot that she has been wearing   Review of Systems  All other systems reviewed and are negative.       Objective:  Physical Exam Vitals and nursing note reviewed.  Constitutional:      Appearance: She is well-developed and well-nourished.  Cardiovascular:     Pulses: Intact distal pulses.  Pulmonary:     Effort: Pulmonary effort is normal.  Musculoskeletal:        General: Normal range of motion.  Skin:    General: Skin is warm.  Neurological:     Mental Status: She is alert.     Neurovascular status intact muscle strength adequate range of motion adequate patient found to have discomfort in the subfirst metatarsal right with inflammation fluid around the sesamoidal complex.  Patient is found to have good digital perfusion well oriented x3     Assessment:  Possibility for fracture first metatarsal or sesamoidal bone with pain inflammation present     Plan:  H&P condition reviewed discussed.  At this point I recommended boot usage thick padding usage offloading and discussed the possibilities for further treatment.  Patient will be seen back to reevaluate in 3 weeks or earlier if needed  X-rays indicate suspicion around the sesamoidal complex but difficult to make complete determination yet and will be judged based on response to continued immobilization

## 2020-11-02 ENCOUNTER — Other Ambulatory Visit: Payer: Self-pay | Admitting: Podiatry

## 2020-11-02 DIAGNOSIS — S92314A Nondisplaced fracture of first metatarsal bone, right foot, initial encounter for closed fracture: Secondary | ICD-10-CM

## 2020-11-04 ENCOUNTER — Telehealth: Payer: Self-pay | Admitting: Podiatry

## 2020-11-04 NOTE — Telephone Encounter (Signed)
The test will not be done until next year so would be new deductible

## 2020-11-04 NOTE — Telephone Encounter (Signed)
Pt called stating you had mentioned ordering additional imaging if her foot was not better and pt states it has not gotten better. She called asking if you could go ahead and order the imaging for this year since her deductible will start over next year.

## 2020-11-12 ENCOUNTER — Other Ambulatory Visit: Payer: Self-pay

## 2020-11-12 ENCOUNTER — Ambulatory Visit (INDEPENDENT_AMBULATORY_CARE_PROVIDER_SITE_OTHER): Payer: BC Managed Care – PPO | Admitting: Podiatry

## 2020-11-12 DIAGNOSIS — S92314A Nondisplaced fracture of first metatarsal bone, right foot, initial encounter for closed fracture: Secondary | ICD-10-CM

## 2020-11-12 DIAGNOSIS — B070 Plantar wart: Secondary | ICD-10-CM

## 2020-11-13 NOTE — Progress Notes (Signed)
Subjective:   Patient ID: Evelyn Lara, female   DOB: 30 y.o.   MRN: [id number]   HPI Patient presents stating that her right foot is feeling some better with pain still present but improved over where it was and on the left there is a very painful lesion that she has tried numerous topical medicines on without success   ROS      Objective:  Physical Exam  Neurovascular status intact with patient found to have reduced discomfort around the tibial sesamoid complex right with mild pain still noted but improved from previously with patient noted to have keratotic lesion submetatarsal left that upon debridement shows pinpoint bleeding pain to lateral pressure     Assessment:  Probability for sesamoidal injury with hopeful healing occurring right and also on the left verruca plantaris     Plan:  H&P reviewed both conditions and for the right we will continue rigid bottom shoes and boot usage as needed and for the left I did debride the lesion fully sharp sterile debridement and I applied a small amount of medication to create immune response with sterile dressing left and instructed on what to do if any blistering were to occur.  We will not utilize topical medicines at this time as she is still breast-feeding.  She will reappoint 1 month to reevaluate this

## 2020-11-18 DIAGNOSIS — Z20822 Contact with and (suspected) exposure to covid-19: Secondary | ICD-10-CM | POA: Diagnosis not present

## 2020-11-18 DIAGNOSIS — J069 Acute upper respiratory infection, unspecified: Secondary | ICD-10-CM | POA: Diagnosis not present

## 2021-02-02 DIAGNOSIS — F53 Postpartum depression: Secondary | ICD-10-CM | POA: Diagnosis not present

## 2021-02-09 DIAGNOSIS — F4320 Adjustment disorder, unspecified: Secondary | ICD-10-CM | POA: Diagnosis not present

## 2021-02-12 ENCOUNTER — Ambulatory Visit: Payer: BC Managed Care – PPO | Admitting: Podiatry

## 2021-03-12 ENCOUNTER — Ambulatory Visit (INDEPENDENT_AMBULATORY_CARE_PROVIDER_SITE_OTHER): Payer: BC Managed Care – PPO | Admitting: Podiatry

## 2021-03-12 ENCOUNTER — Encounter: Payer: Self-pay | Admitting: Podiatry

## 2021-03-12 ENCOUNTER — Other Ambulatory Visit: Payer: Self-pay

## 2021-03-12 ENCOUNTER — Ambulatory Visit (INDEPENDENT_AMBULATORY_CARE_PROVIDER_SITE_OTHER): Payer: BC Managed Care – PPO

## 2021-03-12 DIAGNOSIS — S92314A Nondisplaced fracture of first metatarsal bone, right foot, initial encounter for closed fracture: Secondary | ICD-10-CM

## 2021-03-12 DIAGNOSIS — B070 Plantar wart: Secondary | ICD-10-CM

## 2021-03-13 NOTE — Progress Notes (Signed)
Subjective:   Patient ID: Evelyn Lara, female   DOB: 30 y.o.   MRN: [id number]   HPI Patient states doing quite a bit better with occasional soreness still in my right foot below the bone and the lesion on my left has still been bothering me some but I am still breast-feeding   ROS      Objective:  Physical Exam  Neurovascular status intact with patient's right first metatarsal head still mildly swollen and sore but improved and discomfort with lesion measuring 7 x 7 mm plantar aspect left forefoot that has bleeding upon debridement pain to lateral pressure     Assessment:  Sesamoiditis with possible fracture right with plantarflexed metatarsal bilateral first and verruca plantaris left     Plan:  H&P reviewed both conditions for the right reviewed x-ray and discussed possible orthotics and we will utilize cushioned shoes at the current time.  For the left I did debride and I did discuss at like to use immune treatment but she wants to wait till after she finishes breast-feeding which should be in the next few months and today I applied padding sterile dressing.  Reappoint to recheck and educated her on verruca and treatments

## 2021-04-09 ENCOUNTER — Telehealth: Payer: Self-pay | Admitting: *Deleted

## 2021-04-09 NOTE — Telephone Encounter (Signed)
Patient is calling to request a topical prescription mentioned during office visit. She is no longer breastfeeding and is ready to get started. Please advise.

## 2021-04-14 NOTE — Telephone Encounter (Signed)
If she is not breast feeding we can call in medicine for wart. We don't have immune agent in stock office at this time

## 2021-04-14 NOTE — Telephone Encounter (Signed)
Please advise on topical prescription.

## 2021-04-15 NOTE — Telephone Encounter (Signed)
Diclofenac gel from drug store

## 2021-04-15 NOTE — Telephone Encounter (Signed)
Returned call to patient and gave her information per physician but  she said that a topical prescription was mentioned in the office that she could apply at home. Please advise.

## 2021-04-16 ENCOUNTER — Telehealth: Payer: Self-pay | Admitting: *Deleted

## 2021-04-16 NOTE — Telephone Encounter (Signed)
error 

## 2021-04-16 NOTE — Telephone Encounter (Signed)
Returned call to patient, no answer, left vmessage per Dr Beverlee Nims instructions.

## 2021-04-26 DIAGNOSIS — U071 COVID-19: Secondary | ICD-10-CM | POA: Diagnosis not present

## 2021-05-18 DIAGNOSIS — F4323 Adjustment disorder with mixed anxiety and depressed mood: Secondary | ICD-10-CM | POA: Diagnosis not present

## 2021-06-29 DIAGNOSIS — Z30432 Encounter for removal of intrauterine contraceptive device: Secondary | ICD-10-CM | POA: Diagnosis not present

## 2021-09-23 IMAGING — US US PELVIS COMPLETE
2 series · 15 of 25 positions shown · non-contrast
Comparison: None.

CLINICAL DATA: Postpartum bleeding

EXAM:
TRANSABDOMINAL ULTRASOUND OF PELVIS
TECHNIQUE: Transabdominal ultrasound examination of the pelvis was performed
including evaluation of the uterus, ovaries, adnexal regions, and
pelvic cul-de-sac.

[Series 1: us pelvis complete · 29 acquisitions, 14 frames shown (1 of 2)]
[im 1/29]
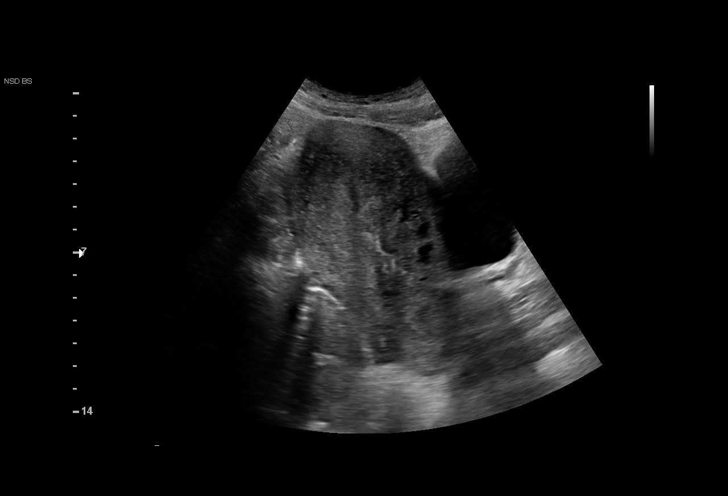
[im 3/29]
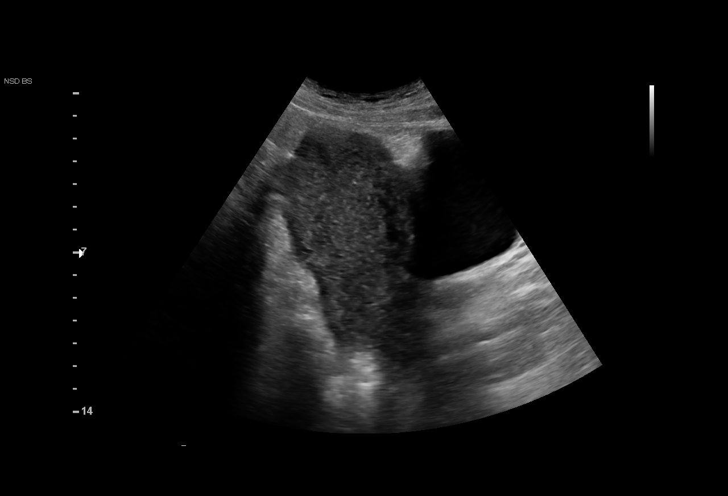
[im 5/29]
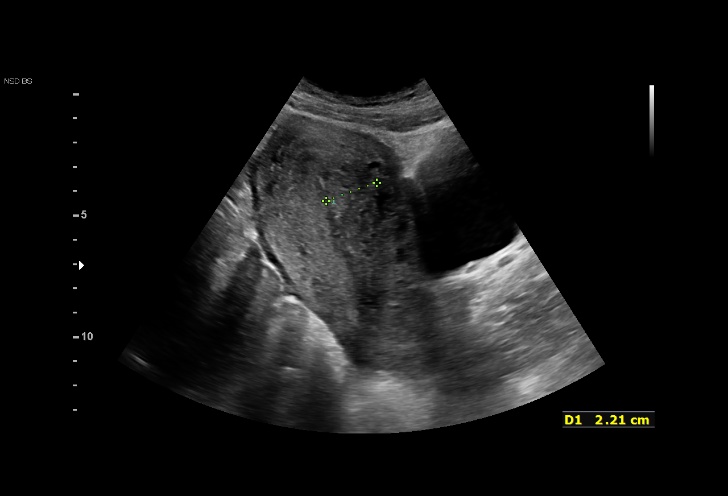
[im 7/29]
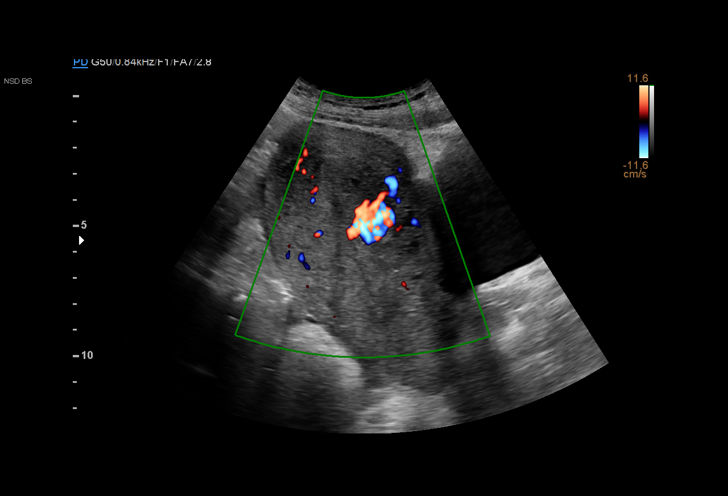
[im 9/29]
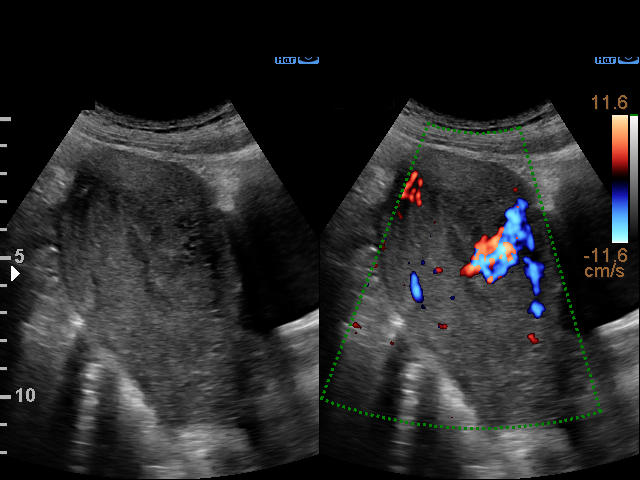
[im 11/29]
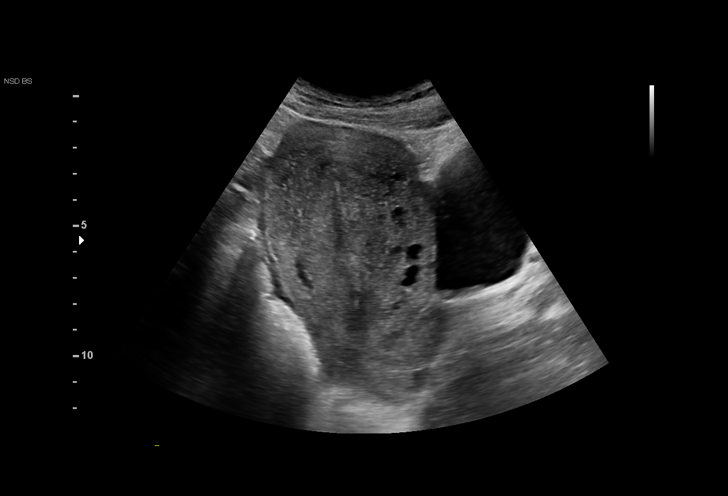
[im 13/29]
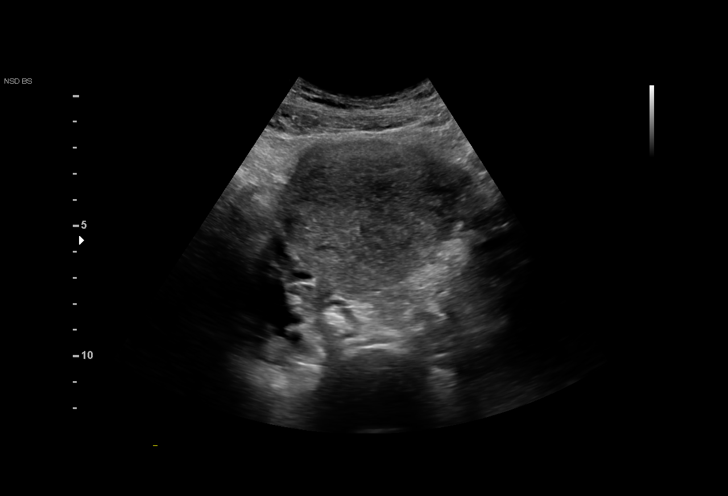
[im 15/29]
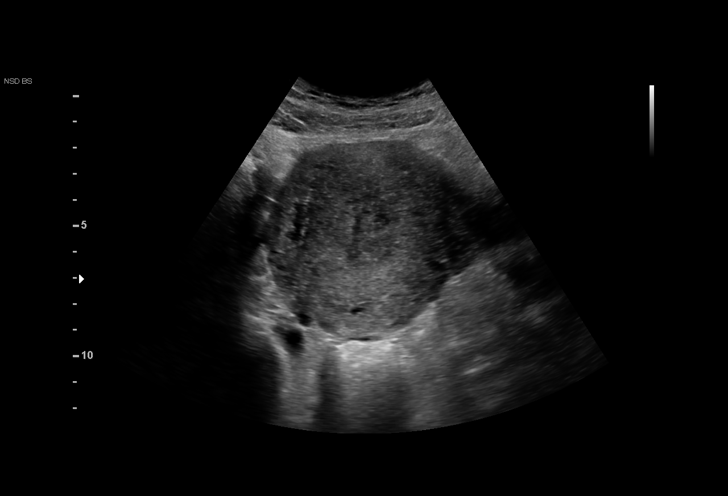
[im 18/29]
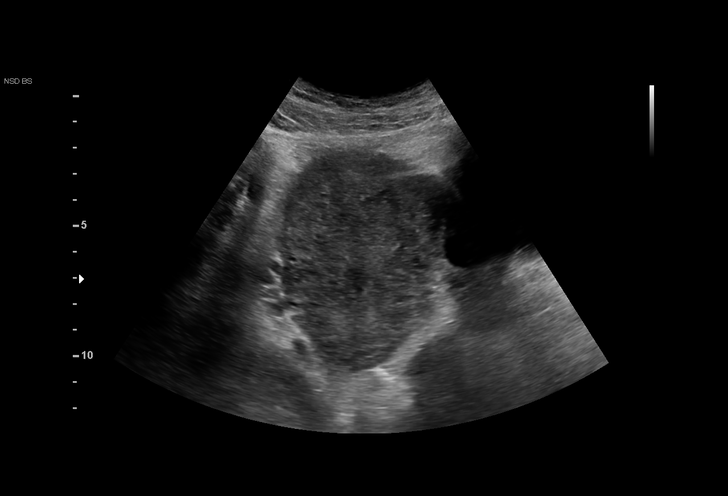
[im 19/29]
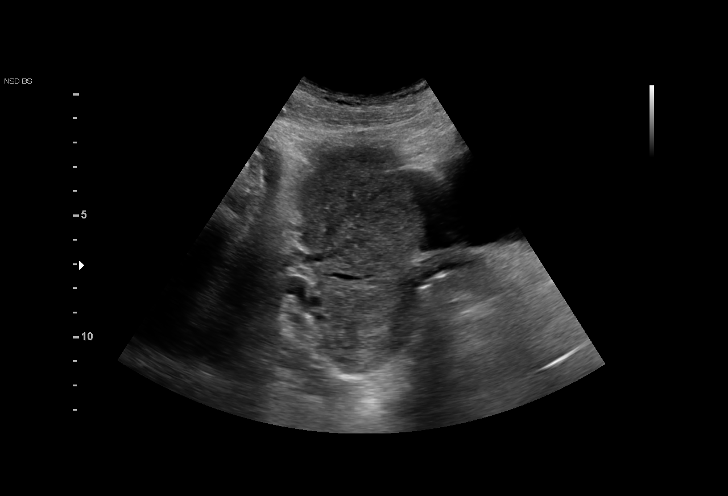
[im 21/29]
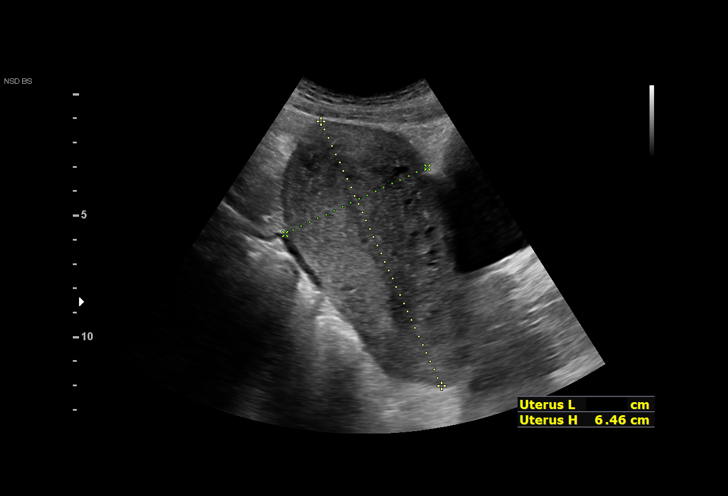
[im 24/29]
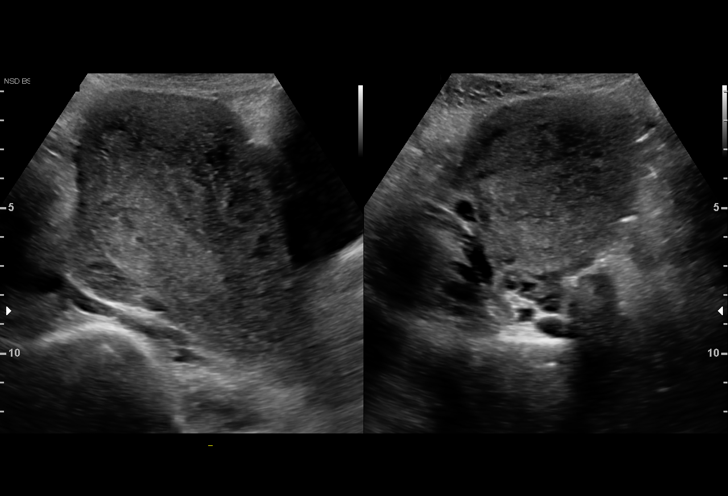
[im 25/29]
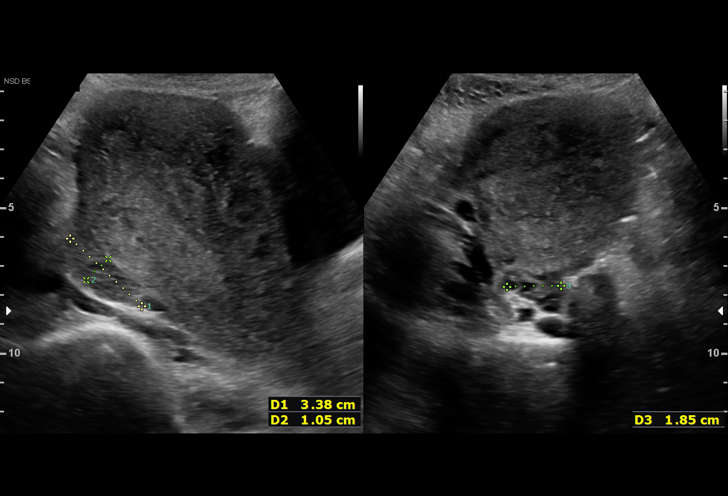
[im 27/29]
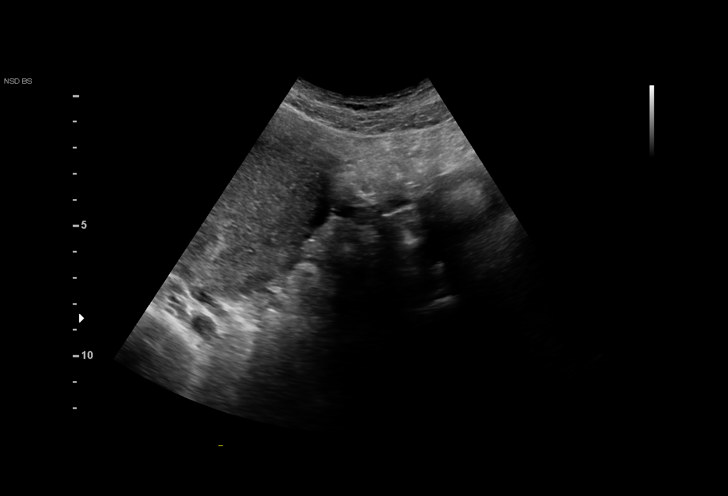

[Series 3: us pelvis complete · 1 of 1 slices shown (2 of 2)]
[im 1/1]
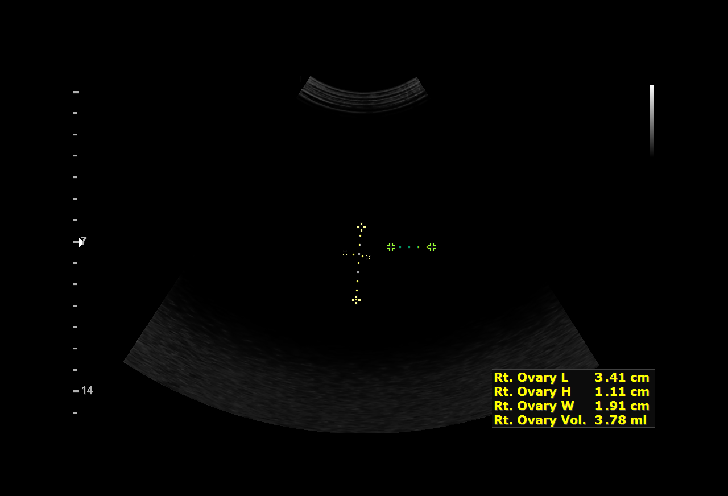

[15 of 25 positions shown; findings below may reference images not displayed]

FINDINGS: Uterus

Measurements: 12 x 6.5 x 7.6 cm = volume: 307.4 mL. No fibroids or
other mass visualized.

Endometrium

Thickness: 22 mm. Heterogenous thickening. Focal areas of increased
vascularity.

Right ovary

Measurements: 3.4 x 1.1 x 1.9 cm = volume: 3.8 mL. Normal
appearance/no adnexal mass.

Left ovary

Not seen

Other findings:  No abnormal free fluid.
IMPRESSION: Endometrial thickness of 22 mm with increased vascularity.
Sonographic findings are compatible with retained products of
conception in the appropriate clinical setting.

## 2021-11-30 DIAGNOSIS — F4323 Adjustment disorder with mixed anxiety and depressed mood: Secondary | ICD-10-CM | POA: Diagnosis not present

## 2021-12-21 DIAGNOSIS — F4323 Adjustment disorder with mixed anxiety and depressed mood: Secondary | ICD-10-CM | POA: Diagnosis not present

## 2022-01-11 DIAGNOSIS — F4323 Adjustment disorder with mixed anxiety and depressed mood: Secondary | ICD-10-CM | POA: Diagnosis not present

## 2022-01-25 DIAGNOSIS — Z0000 Encounter for general adult medical examination without abnormal findings: Secondary | ICD-10-CM | POA: Diagnosis not present

## 2022-01-25 DIAGNOSIS — R030 Elevated blood-pressure reading, without diagnosis of hypertension: Secondary | ICD-10-CM | POA: Diagnosis not present

## 2022-01-25 DIAGNOSIS — R5383 Other fatigue: Secondary | ICD-10-CM | POA: Diagnosis not present

## 2022-01-25 DIAGNOSIS — N951 Menopausal and female climacteric states: Secondary | ICD-10-CM | POA: Diagnosis not present

## 2022-01-25 DIAGNOSIS — Z6827 Body mass index (BMI) 27.0-27.9, adult: Secondary | ICD-10-CM | POA: Diagnosis not present

## 2022-02-08 DIAGNOSIS — R4589 Other symptoms and signs involving emotional state: Secondary | ICD-10-CM | POA: Diagnosis not present

## 2022-02-08 DIAGNOSIS — R6882 Decreased libido: Secondary | ICD-10-CM | POA: Diagnosis not present

## 2022-02-08 DIAGNOSIS — Z8349 Family history of other endocrine, nutritional and metabolic diseases: Secondary | ICD-10-CM | POA: Diagnosis not present

## 2022-02-08 DIAGNOSIS — R5383 Other fatigue: Secondary | ICD-10-CM | POA: Diagnosis not present

## 2022-02-08 DIAGNOSIS — F4323 Adjustment disorder with mixed anxiety and depressed mood: Secondary | ICD-10-CM | POA: Diagnosis not present

## 2022-03-01 DIAGNOSIS — F4323 Adjustment disorder with mixed anxiety and depressed mood: Secondary | ICD-10-CM | POA: Diagnosis not present

## 2022-03-29 DIAGNOSIS — F3341 Major depressive disorder, recurrent, in partial remission: Secondary | ICD-10-CM | POA: Diagnosis not present

## 2022-03-29 DIAGNOSIS — R5383 Other fatigue: Secondary | ICD-10-CM | POA: Diagnosis not present

## 2022-03-29 DIAGNOSIS — Z0000 Encounter for general adult medical examination without abnormal findings: Secondary | ICD-10-CM | POA: Diagnosis not present

## 2022-03-29 DIAGNOSIS — F4323 Adjustment disorder with mixed anxiety and depressed mood: Secondary | ICD-10-CM | POA: Diagnosis not present

## 2022-04-04 DIAGNOSIS — Z713 Dietary counseling and surveillance: Secondary | ICD-10-CM | POA: Diagnosis not present

## 2022-04-12 DIAGNOSIS — M5451 Vertebrogenic low back pain: Secondary | ICD-10-CM | POA: Diagnosis not present

## 2022-04-12 DIAGNOSIS — Z713 Dietary counseling and surveillance: Secondary | ICD-10-CM | POA: Diagnosis not present

## 2022-04-12 DIAGNOSIS — M9901 Segmental and somatic dysfunction of cervical region: Secondary | ICD-10-CM | POA: Diagnosis not present

## 2022-04-12 DIAGNOSIS — M9904 Segmental and somatic dysfunction of sacral region: Secondary | ICD-10-CM | POA: Diagnosis not present

## 2022-04-12 DIAGNOSIS — M9903 Segmental and somatic dysfunction of lumbar region: Secondary | ICD-10-CM | POA: Diagnosis not present

## 2022-04-19 DIAGNOSIS — M9903 Segmental and somatic dysfunction of lumbar region: Secondary | ICD-10-CM | POA: Diagnosis not present

## 2022-04-19 DIAGNOSIS — M5451 Vertebrogenic low back pain: Secondary | ICD-10-CM | POA: Diagnosis not present

## 2022-04-19 DIAGNOSIS — M9904 Segmental and somatic dysfunction of sacral region: Secondary | ICD-10-CM | POA: Diagnosis not present

## 2022-04-19 DIAGNOSIS — M9901 Segmental and somatic dysfunction of cervical region: Secondary | ICD-10-CM | POA: Diagnosis not present

## 2022-04-19 DIAGNOSIS — Z713 Dietary counseling and surveillance: Secondary | ICD-10-CM | POA: Diagnosis not present

## 2022-04-22 DIAGNOSIS — M9901 Segmental and somatic dysfunction of cervical region: Secondary | ICD-10-CM | POA: Diagnosis not present

## 2022-04-22 DIAGNOSIS — M5451 Vertebrogenic low back pain: Secondary | ICD-10-CM | POA: Diagnosis not present

## 2022-04-22 DIAGNOSIS — M9904 Segmental and somatic dysfunction of sacral region: Secondary | ICD-10-CM | POA: Diagnosis not present

## 2022-04-22 DIAGNOSIS — M9903 Segmental and somatic dysfunction of lumbar region: Secondary | ICD-10-CM | POA: Diagnosis not present

## 2022-04-25 DIAGNOSIS — M9904 Segmental and somatic dysfunction of sacral region: Secondary | ICD-10-CM | POA: Diagnosis not present

## 2022-04-25 DIAGNOSIS — M5451 Vertebrogenic low back pain: Secondary | ICD-10-CM | POA: Diagnosis not present

## 2022-04-25 DIAGNOSIS — M9903 Segmental and somatic dysfunction of lumbar region: Secondary | ICD-10-CM | POA: Diagnosis not present

## 2022-04-25 DIAGNOSIS — M9901 Segmental and somatic dysfunction of cervical region: Secondary | ICD-10-CM | POA: Diagnosis not present

## 2022-04-26 DIAGNOSIS — M9901 Segmental and somatic dysfunction of cervical region: Secondary | ICD-10-CM | POA: Diagnosis not present

## 2022-04-26 DIAGNOSIS — Z713 Dietary counseling and surveillance: Secondary | ICD-10-CM | POA: Diagnosis not present

## 2022-04-26 DIAGNOSIS — M5451 Vertebrogenic low back pain: Secondary | ICD-10-CM | POA: Diagnosis not present

## 2022-04-26 DIAGNOSIS — M9904 Segmental and somatic dysfunction of sacral region: Secondary | ICD-10-CM | POA: Diagnosis not present

## 2022-04-26 DIAGNOSIS — F4323 Adjustment disorder with mixed anxiety and depressed mood: Secondary | ICD-10-CM | POA: Diagnosis not present

## 2022-04-26 DIAGNOSIS — M9903 Segmental and somatic dysfunction of lumbar region: Secondary | ICD-10-CM | POA: Diagnosis not present

## 2022-04-28 DIAGNOSIS — M9903 Segmental and somatic dysfunction of lumbar region: Secondary | ICD-10-CM | POA: Diagnosis not present

## 2022-04-28 DIAGNOSIS — M5451 Vertebrogenic low back pain: Secondary | ICD-10-CM | POA: Diagnosis not present

## 2022-04-28 DIAGNOSIS — M9901 Segmental and somatic dysfunction of cervical region: Secondary | ICD-10-CM | POA: Diagnosis not present

## 2022-04-28 DIAGNOSIS — M9904 Segmental and somatic dysfunction of sacral region: Secondary | ICD-10-CM | POA: Diagnosis not present

## 2022-05-06 DIAGNOSIS — Z713 Dietary counseling and surveillance: Secondary | ICD-10-CM | POA: Diagnosis not present

## 2022-05-06 DIAGNOSIS — M5440 Lumbago with sciatica, unspecified side: Secondary | ICD-10-CM | POA: Diagnosis not present

## 2022-05-11 DIAGNOSIS — M5451 Vertebrogenic low back pain: Secondary | ICD-10-CM | POA: Diagnosis not present

## 2022-05-11 DIAGNOSIS — M9901 Segmental and somatic dysfunction of cervical region: Secondary | ICD-10-CM | POA: Diagnosis not present

## 2022-05-11 DIAGNOSIS — M9903 Segmental and somatic dysfunction of lumbar region: Secondary | ICD-10-CM | POA: Diagnosis not present

## 2022-05-11 DIAGNOSIS — M9904 Segmental and somatic dysfunction of sacral region: Secondary | ICD-10-CM | POA: Diagnosis not present

## 2022-05-12 DIAGNOSIS — M9901 Segmental and somatic dysfunction of cervical region: Secondary | ICD-10-CM | POA: Diagnosis not present

## 2022-05-12 DIAGNOSIS — M5451 Vertebrogenic low back pain: Secondary | ICD-10-CM | POA: Diagnosis not present

## 2022-05-12 DIAGNOSIS — M9903 Segmental and somatic dysfunction of lumbar region: Secondary | ICD-10-CM | POA: Diagnosis not present

## 2022-05-12 DIAGNOSIS — M9904 Segmental and somatic dysfunction of sacral region: Secondary | ICD-10-CM | POA: Diagnosis not present

## 2022-05-13 DIAGNOSIS — M5440 Lumbago with sciatica, unspecified side: Secondary | ICD-10-CM | POA: Diagnosis not present

## 2022-05-23 DIAGNOSIS — M9904 Segmental and somatic dysfunction of sacral region: Secondary | ICD-10-CM | POA: Diagnosis not present

## 2022-05-23 DIAGNOSIS — M9903 Segmental and somatic dysfunction of lumbar region: Secondary | ICD-10-CM | POA: Diagnosis not present

## 2022-05-23 DIAGNOSIS — M9901 Segmental and somatic dysfunction of cervical region: Secondary | ICD-10-CM | POA: Diagnosis not present

## 2022-05-23 DIAGNOSIS — M5451 Vertebrogenic low back pain: Secondary | ICD-10-CM | POA: Diagnosis not present

## 2022-05-24 DIAGNOSIS — M5451 Vertebrogenic low back pain: Secondary | ICD-10-CM | POA: Diagnosis not present

## 2022-05-24 DIAGNOSIS — M9903 Segmental and somatic dysfunction of lumbar region: Secondary | ICD-10-CM | POA: Diagnosis not present

## 2022-05-24 DIAGNOSIS — Z713 Dietary counseling and surveillance: Secondary | ICD-10-CM | POA: Diagnosis not present

## 2022-05-24 DIAGNOSIS — M9901 Segmental and somatic dysfunction of cervical region: Secondary | ICD-10-CM | POA: Diagnosis not present

## 2022-05-24 DIAGNOSIS — M9904 Segmental and somatic dysfunction of sacral region: Secondary | ICD-10-CM | POA: Diagnosis not present

## 2022-05-26 DIAGNOSIS — M5451 Vertebrogenic low back pain: Secondary | ICD-10-CM | POA: Diagnosis not present

## 2022-05-26 DIAGNOSIS — M9904 Segmental and somatic dysfunction of sacral region: Secondary | ICD-10-CM | POA: Diagnosis not present

## 2022-05-26 DIAGNOSIS — M9903 Segmental and somatic dysfunction of lumbar region: Secondary | ICD-10-CM | POA: Diagnosis not present

## 2022-05-26 DIAGNOSIS — M9901 Segmental and somatic dysfunction of cervical region: Secondary | ICD-10-CM | POA: Diagnosis not present

## 2022-05-27 DIAGNOSIS — M5440 Lumbago with sciatica, unspecified side: Secondary | ICD-10-CM | POA: Diagnosis not present

## 2022-05-31 DIAGNOSIS — M9904 Segmental and somatic dysfunction of sacral region: Secondary | ICD-10-CM | POA: Diagnosis not present

## 2022-05-31 DIAGNOSIS — M9901 Segmental and somatic dysfunction of cervical region: Secondary | ICD-10-CM | POA: Diagnosis not present

## 2022-05-31 DIAGNOSIS — M9903 Segmental and somatic dysfunction of lumbar region: Secondary | ICD-10-CM | POA: Diagnosis not present

## 2022-05-31 DIAGNOSIS — M5451 Vertebrogenic low back pain: Secondary | ICD-10-CM | POA: Diagnosis not present

## 2022-06-02 DIAGNOSIS — M9904 Segmental and somatic dysfunction of sacral region: Secondary | ICD-10-CM | POA: Diagnosis not present

## 2022-06-02 DIAGNOSIS — M9903 Segmental and somatic dysfunction of lumbar region: Secondary | ICD-10-CM | POA: Diagnosis not present

## 2022-06-02 DIAGNOSIS — M5451 Vertebrogenic low back pain: Secondary | ICD-10-CM | POA: Diagnosis not present

## 2022-06-02 DIAGNOSIS — M9901 Segmental and somatic dysfunction of cervical region: Secondary | ICD-10-CM | POA: Diagnosis not present

## 2022-06-03 DIAGNOSIS — M5440 Lumbago with sciatica, unspecified side: Secondary | ICD-10-CM | POA: Diagnosis not present

## 2022-06-14 DIAGNOSIS — Z713 Dietary counseling and surveillance: Secondary | ICD-10-CM | POA: Diagnosis not present

## 2022-06-17 DIAGNOSIS — M5440 Lumbago with sciatica, unspecified side: Secondary | ICD-10-CM | POA: Diagnosis not present

## 2022-06-24 DIAGNOSIS — M5440 Lumbago with sciatica, unspecified side: Secondary | ICD-10-CM | POA: Diagnosis not present

## 2022-07-08 DIAGNOSIS — M5440 Lumbago with sciatica, unspecified side: Secondary | ICD-10-CM | POA: Diagnosis not present

## 2022-07-12 DIAGNOSIS — Z713 Dietary counseling and surveillance: Secondary | ICD-10-CM | POA: Diagnosis not present

## 2022-07-22 DIAGNOSIS — M5440 Lumbago with sciatica, unspecified side: Secondary | ICD-10-CM | POA: Diagnosis not present

## 2022-08-05 DIAGNOSIS — M5440 Lumbago with sciatica, unspecified side: Secondary | ICD-10-CM | POA: Diagnosis not present

## 2022-08-19 DIAGNOSIS — M5440 Lumbago with sciatica, unspecified side: Secondary | ICD-10-CM | POA: Diagnosis not present

## 2022-09-02 DIAGNOSIS — M5440 Lumbago with sciatica, unspecified side: Secondary | ICD-10-CM | POA: Diagnosis not present

## 2022-09-16 DIAGNOSIS — M5440 Lumbago with sciatica, unspecified side: Secondary | ICD-10-CM | POA: Diagnosis not present

## 2022-09-30 DIAGNOSIS — M5440 Lumbago with sciatica, unspecified side: Secondary | ICD-10-CM | POA: Diagnosis not present

## 2022-11-13 DIAGNOSIS — F419 Anxiety disorder, unspecified: Secondary | ICD-10-CM | POA: Diagnosis not present

## 2022-11-13 DIAGNOSIS — J019 Acute sinusitis, unspecified: Secondary | ICD-10-CM | POA: Diagnosis not present

## 2022-11-15 DIAGNOSIS — Z713 Dietary counseling and surveillance: Secondary | ICD-10-CM | POA: Diagnosis not present
